# Patient Record
Sex: Female | Born: 1950
Health system: Southern US, Community
[De-identification: ages and names within clinical notes are randomized; demographics above are authoritative.]

## PROBLEM LIST (undated history)

## (undated) DIAGNOSIS — I10 Essential (primary) hypertension: Secondary | ICD-10-CM

## (undated) DIAGNOSIS — L570 Actinic keratosis: Secondary | ICD-10-CM

## (undated) DIAGNOSIS — L900 Lichen sclerosus et atrophicus: Secondary | ICD-10-CM

## (undated) DIAGNOSIS — M47812 Spondylosis without myelopathy or radiculopathy, cervical region: Secondary | ICD-10-CM

## (undated) HISTORY — DX: Lichen sclerosus et atrophicus: L90.0

## (undated) HISTORY — DX: Actinic keratosis: L57.0

## (undated) HISTORY — PX: EYE SURGERY: SHX253

## (undated) HISTORY — PX: BUNIONECTOMY: SHX129

## (undated) HISTORY — DX: Spondylosis without myelopathy or radiculopathy, cervical region: M47.812

## (undated) HISTORY — PX: LESION EXCISION: SHX5167

## (undated) HISTORY — DX: Essential (primary) hypertension: I10

---

## 2012-10-23 LAB — HM COLONOSCOPY

## 2016-06-03 DIAGNOSIS — R030 Elevated blood-pressure reading, without diagnosis of hypertension: Secondary | ICD-10-CM | POA: Insufficient documentation

## 2016-06-03 DIAGNOSIS — I10 Essential (primary) hypertension: Secondary | ICD-10-CM | POA: Insufficient documentation

## 2018-04-16 LAB — HM MAMMOGRAPHY

## 2018-06-15 LAB — HM DEXA SCAN

## 2019-03-14 ENCOUNTER — Ambulatory Visit: Payer: Self-pay | Admitting: Nurse Practitioner

## 2019-04-05 ENCOUNTER — Ambulatory Visit: Payer: Self-pay | Admitting: Nurse Practitioner

## 2019-04-23 ENCOUNTER — Encounter: Payer: Self-pay | Admitting: Nurse Practitioner

## 2019-04-23 DIAGNOSIS — M8580 Other specified disorders of bone density and structure, unspecified site: Secondary | ICD-10-CM | POA: Insufficient documentation

## 2019-04-29 ENCOUNTER — Other Ambulatory Visit: Payer: Self-pay

## 2019-04-30 ENCOUNTER — Encounter: Payer: Self-pay | Admitting: Nurse Practitioner

## 2019-04-30 ENCOUNTER — Ambulatory Visit (INDEPENDENT_AMBULATORY_CARE_PROVIDER_SITE_OTHER): Payer: Medicare Other | Admitting: Nurse Practitioner

## 2019-04-30 ENCOUNTER — Other Ambulatory Visit: Payer: Self-pay

## 2019-04-30 DIAGNOSIS — E7800 Pure hypercholesterolemia, unspecified: Secondary | ICD-10-CM | POA: Insufficient documentation

## 2019-04-30 DIAGNOSIS — L900 Lichen sclerosus et atrophicus: Secondary | ICD-10-CM | POA: Diagnosis not present

## 2019-04-30 DIAGNOSIS — R030 Elevated blood-pressure reading, without diagnosis of hypertension: Secondary | ICD-10-CM | POA: Diagnosis not present

## 2019-04-30 DIAGNOSIS — M8580 Other specified disorders of bone density and structure, unspecified site: Secondary | ICD-10-CM

## 2019-04-30 NOTE — Assessment & Plan Note (Signed)
Chronic, ongoing.  Does not wish to take statin.  Continue supplements and monitor labs.  Lipid panel next visit.

## 2019-04-30 NOTE — Progress Notes (Signed)
New Patient Office Visit  Subjective:  Patient ID: Laura Gibson, female    DOB: [birth year]  Age: 68 y.o. MRN: [id number]  CC:  Chief Complaint  Patient presents with  . Establish Care    no concerns     HPI [name] presents for new patient visit to establish care.  Introduced to Designer, jewellery role and practice setting.  All questions answered.  WHITE COAT HYPERTENSION No current medications, does endorse elevation of BP at provider offices, but they trend down after some minutes.  Not checking BP at this time due to having just moved. Hypertension status: stable  BP monitoring frequency:  not checking BP range:  Aspirin: no Recurrent headaches: no Visual changes: no Palpitations: no Dyspnea: no Chest pain: no Lower extremity edema: no Dizzy/lightheaded: no   ELEVATED LDL: Last LDL one year ago 141.  She prefers natural supplements.  Currently taking red yeast rice, plant sterol, COQ10.    LICHEN SCLEROSUS: Previously followed by GYN.  At one time used Premarin cream and Clobetasol for this, but has not used for awhile now.    OSTEOPENIA: Noted on DEXA 06/15/2018.  She continues on Vit D and Calcium.  No recent falls or fractures.  Past Medical History:  Diagnosis Date  . Lichen sclerosus     Past Surgical History:  Procedure Laterality Date  . BUNIONECTOMY    . LESION EXCISION     for lichen sclerosus    Family History  Problem Relation Age of Onset  . Cancer Mother        breast  . Osteoporosis Mother   . Heart disease Father   . Hypertension Father   . Hyperlipidemia Father   . Diabetes Sister   . Hyperlipidemia Sister   . Hyperlipidemia Brother   . Diabetes Paternal Grandmother     Social History   Socioeconomic History  . Marital status: Married    Spouse name: Not on file  . Number of children: Not on file  . Years of education: Not on file  . Highest education level: Not on file  Occupational History  . Not on file    Tobacco Use  . Smoking status: Never Smoker  . Smokeless tobacco: Never Used  Substance and Sexual Activity  . Alcohol use: Yes    Alcohol/week: 7.0 standard drinks    Types: 7 Glasses of wine per week  . Drug use: Never  . Sexual activity: Yes  Other Topics Concern  . Not on file  Social History Narrative  . Not on file   Social Determinants of Health   Financial Resource Strain:   . Difficulty of Paying Living Expenses: Not on file  Food Insecurity:   . Worried About Charity fundraiser in the Last Year: Not on file  . Ran Out of Food in the Last Year: Not on file  Transportation Needs:   . Lack of Transportation (Medical): Not on file  . Lack of Transportation (Non-Medical): Not on file  Physical Activity: Insufficiently Active  . Days of Exercise per Week: 3 days  . Minutes of Exercise per Session: 30 min  Stress:   . Feeling of Stress : Not on file  Social Connections:   . Frequency of Communication with Friends and Family: Not on file  . Frequency of Social Gatherings with Friends and Family: Not on file  . Attends Religious Services: Not on file  . Active Member of Clubs or Organizations: Not on  file  . Attends Archivist Meetings: Not on file  . Marital Status: Not on file  Intimate Partner Violence: Not At Risk  . Fear of Current or Ex-Partner: No  . Emotionally Abused: No  . Physically Abused: No  . Sexually Abused: No    ROS Review of Systems  Constitutional: Negative for activity change, appetite change, diaphoresis, fatigue and fever.  Respiratory: Negative for cough, chest tightness and shortness of breath.   Cardiovascular: Negative for chest pain, palpitations and leg swelling.  Gastrointestinal: Negative for abdominal distention, abdominal pain, constipation, diarrhea, nausea and vomiting.  Endocrine: Negative for cold intolerance and heat intolerance.  Neurological: Negative for dizziness, syncope, weakness, light-headedness, numbness  and headaches.  Psychiatric/Behavioral: Negative.     Objective:   Today's Vitals: BP 130/82 (BP Location: Left Arm, Patient Position: Sitting)   Pulse 71   Temp 98.1 F (36.7 C) (Oral)   Ht 5' 3.5" (1.613 m)   Wt 161 lb 12.8 oz (73.4 kg)   LMP  (LMP Unknown)   SpO2 99%   BMI 28.21 kg/m   Physical Exam Vitals and nursing note reviewed.  Constitutional:      General: She is awake. She is not in acute distress.    Appearance: She is well-developed. She is not ill-appearing.  HENT:     Head: Normocephalic.     Right Ear: Hearing normal.     Left Ear: Hearing normal.  Eyes:     General: Lids are normal.        Right eye: No discharge.        Left eye: No discharge.     Conjunctiva/sclera: Conjunctivae normal.     Pupils: Pupils are equal, round, and reactive to light.  Neck:     Thyroid: No thyromegaly.     Vascular: No carotid bruit.  Cardiovascular:     Rate and Rhythm: Normal rate and regular rhythm.     Heart sounds: Normal heart sounds. No murmur. No gallop.   Pulmonary:     Effort: Pulmonary effort is normal. No accessory muscle usage or respiratory distress.     Breath sounds: Normal breath sounds.  Abdominal:     General: Bowel sounds are normal.     Palpations: Abdomen is soft.     Tenderness: There is no abdominal tenderness.  Musculoskeletal:     Cervical back: Normal range of motion and neck supple.     Right lower leg: No edema.     Left lower leg: No edema.  Skin:    General: Skin is warm and dry.  Neurological:     Mental Status: She is alert and oriented to person, place, and time.  Psychiatric:        Attention and Perception: Attention normal.        Mood and Affect: Mood normal.        Behavior: Behavior normal. Behavior is cooperative.        Thought Content: Thought content normal.        Judgment: Judgment normal.     Assessment & Plan:   Problem List Items Addressed This Visit      Cardiovascular and Mediastinum   White coat  syndrome without hypertension    Initial BP elevated, but repeat below goal.  Recommend she monitor BP at home a few mornings a week and document.  Continue focus on DASH diet and exercise.  Initiate medication as needed, if consistent elevation.  Musculoskeletal and Integument   Osteopenia after menopause    Chronic, ongoing.  Continue daily supplements and repeat scan in 2025.      Lichen sclerosus    No current treatment.  Initiate treatment as needed.        Other   Elevated LDL cholesterol level    Chronic, ongoing.  Does not wish to take statin.  Continue supplements and monitor labs.  Lipid panel next visit.         Outpatient Encounter Medications as of 04/30/2019  Medication Sig  . calcium-vitamin D (OSCAL WITH D) 250-125 MG-UNIT tablet Take 1 tablet by mouth daily.  Marland Kitchen CINNAMON PO Take 1 tablet by mouth daily.  Marland Kitchen co-enzyme Q-10 30 MG capsule Take 1 capsule by mouth daily.  Marland Kitchen MAGNESIUM SULFATE PO Take 1 tablet by mouth daily.  . Multiple Vitamins-Minerals (MULTIVITAMIN ADULT PO) Take 1 tablet by mouth daily.  . Plant Sterols and Stanols 450 MG TABS Take 2 tablets by mouth daily.  . Red Yeast Rice Extract 600 MG CAPS Take 2 capsules by mouth daily.   No facility-administered encounter medications on file as of 04/30/2019.    Follow-up: Return in about 4 weeks (around 05/28/2019) for Annual physical.   Venita Lick, NP

## 2019-04-30 NOTE — Assessment & Plan Note (Signed)
Chronic, ongoing.  Continue daily supplements and repeat scan in 2025.

## 2019-04-30 NOTE — Patient Instructions (Signed)

## 2019-04-30 NOTE — Assessment & Plan Note (Signed)
Initial BP elevated, but repeat below goal.  Recommend she monitor BP at home a few mornings a week and document.  Continue focus on DASH diet and exercise.  Initiate medication as needed, if consistent elevation.

## 2019-04-30 NOTE — Assessment & Plan Note (Signed)
No current treatment.  Initiate treatment as needed.

## 2019-05-28 ENCOUNTER — Telehealth: Payer: Self-pay | Admitting: Nurse Practitioner

## 2019-05-28 NOTE — Telephone Encounter (Signed)
Called patient to schedule Medicare Annual Wellness Visit with Nurse Health Advisor, Tyler Aas at [facility].  DECLINED [place] . Embedded Care Coordination [facility] [email or web address]  [phone]

## 2019-05-29 ENCOUNTER — Encounter: Payer: Medicare Other | Admitting: Nurse Practitioner

## 2019-06-28 ENCOUNTER — Ambulatory Visit (INDEPENDENT_AMBULATORY_CARE_PROVIDER_SITE_OTHER): Payer: Medicare Other | Admitting: Nurse Practitioner

## 2019-06-28 ENCOUNTER — Other Ambulatory Visit: Payer: Self-pay

## 2019-06-28 ENCOUNTER — Encounter: Payer: Self-pay | Admitting: Nurse Practitioner

## 2019-06-28 VITALS — BP 128/82 | HR 79 | Temp 98.8°F | Ht 62.5 in | Wt 162.0 lb

## 2019-06-28 DIAGNOSIS — R030 Elevated blood-pressure reading, without diagnosis of hypertension: Secondary | ICD-10-CM | POA: Diagnosis not present

## 2019-06-28 DIAGNOSIS — L900 Lichen sclerosus et atrophicus: Secondary | ICD-10-CM | POA: Diagnosis not present

## 2019-06-28 DIAGNOSIS — M8580 Other specified disorders of bone density and structure, unspecified site: Secondary | ICD-10-CM | POA: Diagnosis not present

## 2019-06-28 DIAGNOSIS — Z0000 Encounter for general adult medical examination without abnormal findings: Secondary | ICD-10-CM

## 2019-06-28 DIAGNOSIS — Z803 Family history of malignant neoplasm of breast: Secondary | ICD-10-CM

## 2019-06-28 DIAGNOSIS — E7800 Pure hypercholesterolemia, unspecified: Secondary | ICD-10-CM

## 2019-06-28 DIAGNOSIS — Z780 Asymptomatic menopausal state: Secondary | ICD-10-CM

## 2019-06-28 DIAGNOSIS — E559 Vitamin D deficiency, unspecified: Secondary | ICD-10-CM

## 2019-06-28 DIAGNOSIS — Z1231 Encounter for screening mammogram for malignant neoplasm of breast: Secondary | ICD-10-CM

## 2019-06-28 NOTE — Assessment & Plan Note (Signed)
Mammogram ordered, can not obtain until 6 weeks after second Covid vaccine, approx around April 6th.

## 2019-06-28 NOTE — Assessment & Plan Note (Signed)
Ongoing.  Initial BP elevated, but repeat below goal.  Recommend she monitor BP at home a few mornings/evenings a week and document. Continue focus on DASH diet and exercise.  Initiate medication as needed, if consistent elevation and patient agreeable.

## 2019-06-28 NOTE — Progress Notes (Signed)
BP 128/82 (BP Location: Left Arm, Patient Position: Sitting) Comment: manual  Pulse 79   Temp 98.8 F (37.1 C) (Oral)   Ht 5' 2.5" (1.588 m)   Wt 162 lb (73.5 kg)   LMP  (LMP Unknown)   SpO2 95%   BMI 29.16 kg/m    Subjective:    Patient ID: [name], female    DOB: 23-Dec-1950, 69 y.o.   MRN: [id number]  HPI: Laura Gibson is a 69 y.o. female presenting on 06/28/2019 for comprehensive medical examination. Current medical complaints include:none  She currently lives with: daughter Menopausal Symptoms: no   HYPERTENSION & ELEVATED LDL White coat syndrome.  On previous labs LDL 141 and TCHOL 227 with previous provider. She prefers natural supplements.  Currently taking red yeast rice, plant sterol, COQ10.  Is not checking BP at home as of yet, BP cuff is in storage. Hypertension status: stable  Satisfied with current treatment? yes Duration of hypertension: chronic BP monitoring frequency:  not checking BP range:  BP medication side effects:  no Medication compliance: good compliance Aspirin: no Recurrent headaches: no Visual changes: no Palpitations: no Dyspnea: no Chest pain: no Lower extremity edema: no Dizzy/lightheaded: no  The ASCVD Risk score Mikey Bussing DC Jr., et al., 2013) failed to calculate for the following reasons:   Cannot find a previous HDL lab   LICHEN SCLEROSUS: Previously followed by GYN.  At one time used Premarin cream and Clobetasol for this, but has not used for awhile now.  This is not longer a problem.  OSTEOPENIA: Noted on DEXA 06/15/2018.  She continues on Vit D and Calcium.  No recent falls or fractures.  Depression Screen done today and results listed below:  Depression screen [facility] 2/9 06/28/2019 04/30/2019  Decreased Interest 0 0  Down, Depressed, Hopeless 0 0  PHQ - 2 Score 0 0    The patient does not have a history of falls. I did not complete a risk assessment for falls. A plan of care for falls was not documented.   Past  Medical History:  Past Medical History:  Diagnosis Date  . Lichen sclerosus     Surgical History:  Past Surgical History:  Procedure Laterality Date  . BUNIONECTOMY    . LESION EXCISION     for lichen sclerosus    Medications:  Current Outpatient Medications on File Prior to Visit  Medication Sig  . calcium-vitamin D (OSCAL WITH D) 250-125 MG-UNIT tablet Take 1 tablet by mouth daily.  Marland Kitchen CINNAMON PO Take 1 tablet by mouth daily.  Marland Kitchen co-enzyme Q-10 30 MG capsule Take 1 capsule by mouth daily.  Marland Kitchen MAGNESIUM SULFATE PO Take 1 tablet by mouth daily.  . Multiple Vitamins-Minerals (MULTIVITAMIN ADULT PO) Take 1 tablet by mouth daily.  . Plant Sterols and Stanols 450 MG TABS Take 2 tablets by mouth daily.  . Red Yeast Rice Extract 600 MG CAPS Take 2 capsules by mouth daily.   No current facility-administered medications on file prior to visit.    Allergies:  No Known Allergies  Social History:  Social History   Socioeconomic History  . Marital status: Married    Spouse name: Not on file  . Number of children: Not on file  . Years of education: Not on file  . Highest education level: Not on file  Occupational History  . Not on file  Tobacco Use  . Smoking status: Never Smoker  . Smokeless tobacco: Never Used  Substance and Sexual  Activity  . Alcohol use: Yes    Alcohol/week: 7.0 standard drinks    Types: 7 Glasses of wine per week  . Drug use: Never  . Sexual activity: Yes  Other Topics Concern  . Not on file  Social History Narrative  . Not on file   Social Determinants of Health   Financial Resource Strain:   . Difficulty of Paying Living Expenses: Not on file  Food Insecurity:   . Worried About Charity fundraiser in the Last Year: Not on file  . Ran Out of Food in the Last Year: Not on file  Transportation Needs:   . Lack of Transportation (Medical): Not on file  . Lack of Transportation (Non-Medical): Not on file  Physical Activity: Insufficiently Active   . Days of Exercise per Week: 3 days  . Minutes of Exercise per Session: 30 min  Stress:   . Feeling of Stress : Not on file  Social Connections:   . Frequency of Communication with Friends and Family: Not on file  . Frequency of Social Gatherings with Friends and Family: Not on file  . Attends Religious Services: Not on file  . Active Member of Clubs or Organizations: Not on file  . Attends Archivist Meetings: Not on file  . Marital Status: Not on file  Intimate Partner Violence: Not At Risk  . Fear of Current or Ex-Partner: No  . Emotionally Abused: No  . Physically Abused: No  . Sexually Abused: No   Social History   Tobacco Use  Smoking Status Never Smoker  Smokeless Tobacco Never Used   Social History   Substance and Sexual Activity  Alcohol Use Yes  . Alcohol/week: 7.0 standard drinks  . Types: 7 Glasses of wine per week    Family History:  Family History  Problem Relation Age of Onset  . Cancer Mother        breast  . Osteoporosis Mother   . Heart disease Father   . Hypertension Father   . Hyperlipidemia Father   . Diabetes Sister   . Hyperlipidemia Sister   . Hyperlipidemia Brother   . Diabetes Paternal Grandmother     Past medical history, surgical history, medications, allergies, family history and social history reviewed with patient today and changes made to appropriate areas of the chart.   Review of Systems - negative All other ROS negative except what is listed above and in the HPI.      Objective:    BP 128/82 (BP Location: Left Arm, Patient Position: Sitting) Comment: manual  Pulse 79   Temp 98.8 F (37.1 C) (Oral)   Ht 5' 2.5" (1.588 m)   Wt 162 lb (73.5 kg)   LMP  (LMP Unknown)   SpO2 95%   BMI 29.16 kg/m   Wt Readings from Last 3 Encounters:  06/28/19 162 lb (73.5 kg)  04/30/19 161 lb 12.8 oz (73.4 kg)    Physical Exam Constitutional:      General: She is awake. She is not in acute distress.    Appearance: She is  well-developed and overweight. She is not ill-appearing.  HENT:     Head: Normocephalic and atraumatic.     Right Ear: Hearing, tympanic membrane, ear canal and external ear normal. No drainage.     Left Ear: Hearing, tympanic membrane, ear canal and external ear normal. No drainage.     Nose: Nose normal.     Right Sinus: No maxillary sinus tenderness or  frontal sinus tenderness.     Left Sinus: No maxillary sinus tenderness or frontal sinus tenderness.     Mouth/Throat:     Mouth: Mucous membranes are moist.     Pharynx: Oropharynx is clear. Uvula midline. No pharyngeal swelling, oropharyngeal exudate or posterior oropharyngeal erythema.  Eyes:     General: Lids are normal.        Right eye: No discharge.        Left eye: No discharge.     Extraocular Movements: Extraocular movements intact.     Conjunctiva/sclera: Conjunctivae normal.     Pupils: Pupils are equal, round, and reactive to light.     Visual Fields: Right eye visual fields normal and left eye visual fields normal.  Neck:     Thyroid: No thyromegaly.     Vascular: No carotid bruit.     Trachea: Trachea normal.  Cardiovascular:     Rate and Rhythm: Normal rate and regular rhythm.     Heart sounds: Normal heart sounds. No murmur. No gallop.   Pulmonary:     Effort: Pulmonary effort is normal. No accessory muscle usage or respiratory distress.     Breath sounds: Normal breath sounds.  Chest:     Breasts:        Right: Normal.        Left: Normal.  Abdominal:     General: Bowel sounds are normal.     Palpations: Abdomen is soft. There is no hepatomegaly or splenomegaly.     Tenderness: There is no abdominal tenderness.  Musculoskeletal:        General: Normal range of motion.     Cervical back: Normal range of motion and neck supple.     Right lower leg: No edema.     Left lower leg: No edema.  Lymphadenopathy:     Head:     Right side of head: No submental, submandibular, tonsillar, preauricular or posterior  auricular adenopathy.     Left side of head: No submental, submandibular, tonsillar, preauricular or posterior auricular adenopathy.     Cervical: No cervical adenopathy.     Upper Body:     Right upper body: No supraclavicular, axillary or pectoral adenopathy.     Left upper body: No supraclavicular, axillary or pectoral adenopathy.  Skin:    General: Skin is warm and dry.     Capillary Refill: Capillary refill takes less than 2 seconds.     Findings: No rash.  Neurological:     Mental Status: She is alert and oriented to person, place, and time.     Cranial Nerves: Cranial nerves are intact.     Gait: Gait is intact.     Deep Tendon Reflexes: Reflexes are normal and symmetric.     Reflex Scores:      Brachioradialis reflexes are 2+ on the right side and 2+ on the left side.      Patellar reflexes are 2+ on the right side and 2+ on the left side. Psychiatric:        Attention and Perception: Attention normal.        Mood and Affect: Mood normal.        Speech: Speech normal.        Behavior: Behavior normal. Behavior is cooperative.        Thought Content: Thought content normal.        Judgment: Judgment normal.     Results for orders placed or performed in visit on  04/30/19  HM MAMMOGRAPHY  Result Value Ref Range   HM Mammogram 0-4 Bi-Rad 0-4 Bi-Rad, Self Reported Normal  HM DEXA SCAN  Result Value Ref Range   HM Dexa Scan see result in chart   HM COLONOSCOPY  Result Value Ref Range   HM Colonoscopy See Report (in chart) See Report (in chart), Patient Reported      Assessment & Plan:   Problem List Items Addressed This Visit      Cardiovascular and Mediastinum   White coat syndrome without hypertension    Ongoing.  Initial BP elevated, but repeat below goal.  Recommend she monitor BP at home a few mornings/evenings a week and document. Continue focus on DASH diet and exercise.  Initiate medication as needed, if consistent elevation and patient agreeable.       Relevant Orders   CBC with Differential/Platelet   Comprehensive metabolic panel   TSH     Musculoskeletal and Integument   Osteopenia after menopause    Continue supplements, check Vit D level today.  Next DEXA due in 2025.      Relevant Orders   VITAMIN D 25 Hydroxy (Vit-D Deficiency, Fractures)   Lichen sclerosus    No current treatment.  Initiate treatment as needed.  She would like to continue pap exams, next due December 2022.        Other   Elevated LDL cholesterol level    Chronic, ongoing.  Does not wish to take statin.  Continue supplements and monitor labs.  Lipid panel this visit.      Relevant Orders   Lipid Panel w/o Chol/HDL Ratio   Vitamin D deficiency    History of low levels, recheck today.  Continue supplement and adjust as needed.      Relevant Orders   VITAMIN D 25 Hydroxy (Vit-D Deficiency, Fractures)   Family history of breast cancer    Mammogram ordered, can not obtain until 6 weeks after second Covid vaccine, approx around April 6th.         Other Visit Diagnoses    Annual physical exam    -  Primary   Annual labs to include TSH, lipid, CMP, CBC   Relevant Orders   CBC with Differential/Platelet   Encounter for screening mammogram for malignant neoplasm of breast       mammogram order, family history of breast cancer at 59 in mother.   Relevant Orders   MM DIGITAL SCREENING BILATERAL       Follow up plan: Return in about 1 year (around 06/27/2020) for Annual physical.   LABORATORY TESTING:  - Pap smear: not applicable -- last pap December 2019, she would like to continue these, next 2022  IMMUNIZATIONS:   - Tdap: Tetanus vaccination status reviewed: refused today - Influenza: Up to date - Pneumovax: Up to date - Prevnar: Up to date - HPV: Not applicable - Zostavax vaccine: Refused  SCREENING: -Mammogram: Up to date -- last 04/2018 -- just had Covid vaccine and will wait for 6 weeks after second dose -- gets second February 26th -  Colonoscopy: Up to date - last June 2014 - Bone Density: Up to date - last 06/15/2018 -Hearing Test: Not applicable  -Spirometry: Not applicable   PATIENT COUNSELING:   Advised to take 1 mg of folate supplement per day if capable of pregnancy.   Sexuality: Discussed sexually transmitted diseases, partner selection, use of condoms, avoidance of unintended pregnancy  and contraceptive alternatives.   Advised to avoid cigarette  smoking.  I discussed with the patient that most people either abstain from alcohol or drink within safe limits (<=14/week and <=4 drinks/occasion for males, <=7/weeks and <= 3 drinks/occasion for females) and that the risk for alcohol disorders and other health effects rises proportionally with the number of drinks per week and how often a drinker exceeds daily limits.  Discussed cessation/primary prevention of drug use and availability of treatment for abuse.   Diet: Encouraged to adjust caloric intake to maintain  or achieve ideal body weight, to reduce intake of dietary saturated fat and total fat, to limit sodium intake by avoiding high sodium foods and not adding table salt, and to maintain adequate dietary potassium and calcium preferably from fresh fruits, vegetables, and low-fat dairy products.    stressed the importance of regular exercise  Injury prevention: Discussed safety belts, safety helmets, smoke detector, smoking near bedding or upholstery.   Dental health: Discussed importance of regular tooth brushing, flossing, and dental visits.    NEXT PREVENTATIVE PHYSICAL DUE IN 1 YEAR. Return in about 1 year (around 06/27/2020) for Annual physical.

## 2019-06-28 NOTE — Assessment & Plan Note (Signed)
Continue supplements, check Vit D level today.  Next DEXA due in 2025. °

## 2019-06-28 NOTE — Patient Instructions (Addendum)
[facility]  Address: [address] [place], [place], [place] [address]  Phone: [phone]  Health Maintenance, Female Adopting a healthy lifestyle and getting preventive care are important in promoting health and wellness. Ask your health care provider about:  The right schedule for you to have regular tests and exams.  Things you can do on your own to prevent diseases and keep yourself healthy. What should I know about diet, weight, and exercise? Eat a healthy diet   Eat a diet that includes plenty of vegetables, fruits, low-fat dairy products, and lean protein.  Do not eat a lot of foods that are high in solid fats, added sugars, or sodium. Maintain a healthy weight Body mass index (BMI) is used to identify weight problems. It estimates body fat based on height and weight. Your health care provider can help determine your BMI and help you achieve or maintain a healthy weight. Get regular exercise Get regular exercise. This is one of the most important things you can do for your health. Most adults should:  Exercise for at least 150 minutes each week. The exercise should increase your heart rate and make you sweat (moderate-intensity exercise).  Do strengthening exercises at least twice a week. This is in addition to the moderate-intensity exercise.  Spend less time sitting. Even light physical activity can be beneficial. Watch cholesterol and blood lipids Have your blood tested for lipids and cholesterol at 71 years of age, then have this test every 5 years. Have your cholesterol levels checked more often if:  Your lipid or cholesterol levels are high.  You are older than 71 years of age.  You are at high risk for heart disease. What should I know about cancer screening? Depending on your health history and family history, you may need to have cancer screening at various ages. This may include screening for:  Breast cancer.  Cervical  cancer.  Colorectal cancer.  Skin cancer.  Lung cancer. What should I know about heart disease, diabetes, and high blood pressure? Blood pressure and heart disease  High blood pressure causes heart disease and increases the risk of stroke. This is more likely to develop in people who have high blood pressure readings, are of African descent, or are overweight.  Have your blood pressure checked: ? Every 3-5 years if you are 39-42 years of age. ? Every year if you are 59 years old or older. Diabetes Have regular diabetes screenings. This checks your fasting blood sugar level. Have the screening done:  Once every three years after age 58 if you are at a normal weight and have a low risk for diabetes.  More often and at a younger age if you are overweight or have a high risk for diabetes. What should I know about preventing infection? Hepatitis B If you have a higher risk for hepatitis B, you should be screened for this virus. Talk with your health care provider to find out if you are at risk for hepatitis B infection. Hepatitis C Testing is recommended for:  Everyone born from [birth year] through 1967.  Anyone with known risk factors for hepatitis C. Sexually transmitted infections (STIs)  Get screened for STIs, including gonorrhea and chlamydia, if: ? You are sexually active and are younger than 71 years of age. ? You are older than 71 years of age and your health care provider tells you that you are at risk for this type of infection. ? Your sexual activity has changed  since you were last screened, and you are at increased risk for chlamydia or gonorrhea. Ask your health care provider if you are at risk.  Ask your health care provider about whether you are at high risk for HIV. Your health care provider may recommend a prescription medicine to help prevent HIV infection. If you choose to take medicine to prevent HIV, you should first get tested for HIV. You should then be tested every 3  months for as long as you are taking the medicine. Pregnancy  If you are about to stop having your period (premenopausal) and you may become pregnant, seek counseling before you get pregnant.  Take 400 to 800 micrograms (mcg) of folic acid every day if you become pregnant.  Ask for birth control (contraception) if you want to prevent pregnancy. Osteoporosis and menopause Osteoporosis is a disease in which the bones lose minerals and strength with aging. This can result in bone fractures. If you are 69 years old or older, or if you are at risk for osteoporosis and fractures, ask your health care provider if you should:  Be screened for bone loss.  Take a calcium or vitamin D supplement to lower your risk of fractures.  Be given hormone replacement therapy (HRT) to treat symptoms of menopause. Follow these instructions at home: Lifestyle  Do not use any products that contain nicotine or tobacco, such as cigarettes, e-cigarettes, and chewing tobacco. If you need help quitting, ask your health care provider.  Do not use street drugs.  Do not share needles.  Ask your health care provider for help if you need support or information about quitting drugs. Alcohol use  Do not drink alcohol if: ? Your health care provider tells you not to drink. ? You are pregnant, may be pregnant, or are planning to become pregnant.  If you drink alcohol: ? Limit how much you use to 0-1 drink a day. ? Limit intake if you are breastfeeding.  Be aware of how much alcohol is in your drink. In the [place] one drink equals one 12 oz bottle of beer (355 mL), one 5 oz glass of wine (148 mL), or one 1 oz glass of hard liquor (44 mL). General instructions  Schedule regular health, dental, and eye exams.  Stay current with your vaccines.  Tell your health care provider if: ? You often feel depressed. ? You have ever been abused or do not feel safe at home. Summary  Adopting a healthy lifestyle and getting  preventive care are important in promoting health and wellness.  Follow your health care provider's instructions about healthy diet, exercising, and getting tested or screened for diseases.  Follow your health care provider's instructions on monitoring your cholesterol and blood pressure. This information is not intended to replace advice given to you by your health care provider. Make sure you discuss any questions you have with your health care provider. Document Revised: 04/25/2018 Document Reviewed: 04/25/2018 Elsevier Patient Education  2020 [employer] Exercise Recommendation  Being physically active is important to prevent heart disease and stroke, the nation's No. 1and No. 5killers. To improve overall cardiovascular health, we suggest at least 150 minutes per week of moderate exercise or 75 minutes per week of vigorous exercise (or a combination of moderate and vigorous activity). Thirty minutes a day, five times a week is an easy goal to remember. You will also experience benefits even if you divide your time into two or three segments of  10 to 15 minutes per day.  For people who would benefit from lowering their blood pressure or cholesterol, we recommend 40 minutes of aerobic exercise of moderate to vigorous intensity three to four times a week to lower the risk for heart attack and stroke.  Physical activity is anything that makes you move your body and burn calories.  This includes things like climbing stairs or playing sports. Aerobic exercises benefit your heart, and include walking, jogging, swimming or biking. Strength and stretching exercises are best for overall stamina and flexibility.  The simplest, positive change you can make to effectively improve your heart health is to start walking. It's enjoyable, free, easy, social and great exercise. A walking program is flexible and boasts high success rates because people can stick with it. It's  easy for walking to become a regular and satisfying part of life.   For Overall Cardiovascular Health:  At least 30 minutes of moderate-intensity aerobic activity at least 5 days per week for a total of 150  OR   At least 25 minutes of vigorous aerobic activity at least 3 days per week for a total of 75 minutes; or a combination of moderate- and vigorous-intensity aerobic activity  AND   Moderate- to high-intensity muscle-strengthening activity at least 2 days per week for additional health benefits.  For Lowering Blood Pressure and Cholesterol  An average 40 minutes of moderate- to vigorous-intensity aerobic activity 3 or 4 times per week  What if I can't make it to the time goal? Something is always better than nothing! And everyone has to start somewhere. Even if you've been sedentary for years, today is the day you can begin to make healthy changes in your life. If you don't think you'll make it for 30 or 40 minutes, set a reachable goal for today. You can work up toward your overall goal by increasing your time as you get stronger. Don't let all-or-nothing thinking rob you of doing what you can every day.  Source:http://www.heart.org

## 2019-06-28 NOTE — Assessment & Plan Note (Signed)
History of low levels, recheck today.  Continue supplement and adjust as needed.

## 2019-06-28 NOTE — Assessment & Plan Note (Signed)
Chronic, ongoing.  Does not wish to take statin.  Continue supplements and monitor labs.  Lipid panel this visit. °

## 2019-06-28 NOTE — Assessment & Plan Note (Addendum)
No current treatment.  Initiate treatment as needed.  She would like to continue pap exams, next due December 2022. 

## 2019-06-29 LAB — CBC WITH DIFFERENTIAL/PLATELET
Basophils Absolute: 0 10*3/uL (ref 0.0–0.2)
Basos: 1 %
EOS (ABSOLUTE): 0.2 10*3/uL (ref 0.0–0.4)
Eos: 4 %
Hematocrit: 44.8 % (ref 34.0–46.6)
Hemoglobin: 14.7 g/dL (ref 11.1–15.9)
Immature Grans (Abs): 0 10*3/uL (ref 0.0–0.1)
Immature Granulocytes: 0 %
Lymphocytes Absolute: 1.4 10*3/uL (ref 0.7–3.1)
Lymphs: 27 %
MCH: 29.9 pg (ref 26.6–33.0)
MCHC: 32.8 g/dL (ref 31.5–35.7)
MCV: 91 fL (ref 79–97)
Monocytes Absolute: 0.4 10*3/uL (ref 0.1–0.9)
Monocytes: 8 %
Neutrophils Absolute: 3.2 10*3/uL (ref 1.4–7.0)
Neutrophils: 60 %
Platelets: 253 10*3/uL (ref 150–450)
RBC: 4.92 x10E6/uL (ref 3.77–5.28)
RDW: 12.4 % (ref 11.7–15.4)
WBC: 5.3 10*3/uL (ref 3.4–10.8)

## 2019-06-29 LAB — COMPREHENSIVE METABOLIC PANEL
ALT: 16 IU/L (ref 0–32)
AST: 16 IU/L (ref 0–40)
Albumin/Globulin Ratio: 1.7 (ref 1.2–2.2)
Albumin: 4.3 g/dL (ref 3.8–4.8)
Alkaline Phosphatase: 86 IU/L (ref 39–117)
BUN/Creatinine Ratio: 21 (ref 12–28)
BUN: 15 mg/dL (ref 8–27)
Bilirubin Total: 0.3 mg/dL (ref 0.0–1.2)
CO2: 25 mmol/L (ref 20–29)
Calcium: 9.5 mg/dL (ref 8.7–10.3)
Chloride: 102 mmol/L (ref 96–106)
Creatinine, Ser: 0.72 mg/dL (ref 0.57–1.00)
GFR calc Af Amer: 98 mL/min/{1.73_m2} (ref >59)
GFR calc non Af Amer: 85 mL/min/{1.73_m2} (ref >59)
Globulin, Total: 2.5 g/dL (ref 1.5–4.5)
Glucose: 94 mg/dL (ref 65–99)
Potassium: 4.8 mmol/L (ref 3.5–5.2)
Sodium: 143 mmol/L (ref 134–144)
Total Protein: 6.8 g/dL (ref 6.0–8.5)

## 2019-06-29 LAB — LIPID PANEL W/O CHOL/HDL RATIO
Cholesterol, Total: 242 mg/dL — ABNORMAL HIGH (ref 100–199)
HDL: 64 mg/dL (ref >39)
LDL Chol Calc (NIH): 152 mg/dL — ABNORMAL HIGH (ref 0–99)
Triglycerides: 144 mg/dL (ref 0–149)
VLDL Cholesterol Cal: 26 mg/dL (ref 5–40)

## 2019-06-29 LAB — VITAMIN D 25 HYDROXY (VIT D DEFICIENCY, FRACTURES): Vit D, 25-Hydroxy: 37.4 ng/mL (ref 30.0–100.0)

## 2019-06-29 LAB — TSH: TSH: 2.63 u[IU]/mL (ref 0.450–4.500)

## 2019-06-29 NOTE — Progress Notes (Signed)
Please let Ms. Betton, that labs have returned: - CBC shows no anemia - Kidney and liver function are normal - Thyroid testing normal and Vitamin D is normal.   - Cholesterol levels are still elevated, but cardiac risk score is 9.8%.  She can continue focus on diet and supplements at this time.  Recommend the American Heart Association for low cholesterol diet recipes. Overall she is doing a great job!!  It was wonderful to see her and hope she has a great day!!

## 2019-11-14 ENCOUNTER — Telehealth: Payer: Self-pay | Admitting: Nurse Practitioner

## 2019-11-14 NOTE — Telephone Encounter (Signed)
Copied from [place] [id number]. Topic: Medicare AWV >> Nov 14, 2019  2:10 PM Cher Nakai R wrote: Reason for CRM: Left message for patient to call back and schedule Medicare Annual Wellness Visit (AWV) to be done virtually.  No hx of AWV per [place] 01/15/2015  Please schedule at anytime with [facility] Ad[facility].      42 Minutes appointment

## 2020-01-03 ENCOUNTER — Inpatient Hospital Stay
Admission: RE | Admit: 2020-01-03 | Discharge: 2020-01-03 | Disposition: A | Discharge status: Home / Self Care | Payer: Self-pay | Source: Ambulatory Visit | Attending: *Deleted | Admitting: *Deleted

## 2020-01-03 ENCOUNTER — Other Ambulatory Visit: Payer: Self-pay

## 2020-01-03 ENCOUNTER — Other Ambulatory Visit: Payer: Self-pay | Admitting: *Deleted

## 2020-01-03 ENCOUNTER — Ambulatory Visit
Admission: RE | Admit: 2020-01-03 | Discharge: 2020-01-03 | Disposition: A | Discharge status: Home / Self Care | Payer: Medicare Other | Source: Ambulatory Visit | Attending: Nurse Practitioner | Admitting: Nurse Practitioner

## 2020-01-03 DIAGNOSIS — Z1231 Encounter for screening mammogram for malignant neoplasm of breast: Secondary | ICD-10-CM | POA: Diagnosis not present

## 2020-01-30 ENCOUNTER — Telehealth: Payer: Self-pay | Admitting: Nurse Practitioner

## 2020-01-30 NOTE — Telephone Encounter (Signed)
Patient declined the Medicare Wellness Visit with [facility]. Patient does not feel there is a need for her to have AWVS. Stated that her yearly physical is sufficient at this time.

## 2020-01-30 NOTE — Telephone Encounter (Signed)
Noted  

## 2020-04-27 ENCOUNTER — Telehealth: Payer: Self-pay | Admitting: Nurse Practitioner

## 2020-04-27 NOTE — Telephone Encounter (Signed)
Patient declined the Medicare Wellness Visit with [facility]  Patient is not interested scheduling. Only wants to schedule CPE with Provider

## 2020-07-06 ENCOUNTER — Ambulatory Visit (INDEPENDENT_AMBULATORY_CARE_PROVIDER_SITE_OTHER): Payer: Medicare Other | Admitting: Nurse Practitioner

## 2020-07-06 ENCOUNTER — Other Ambulatory Visit: Payer: Self-pay

## 2020-07-06 ENCOUNTER — Encounter: Payer: Self-pay | Admitting: Nurse Practitioner

## 2020-07-06 VITALS — BP 134/78 | HR 85 | Temp 98.4°F | Ht 62.5 in | Wt 165.8 lb

## 2020-07-06 DIAGNOSIS — Z803 Family history of malignant neoplasm of breast: Secondary | ICD-10-CM

## 2020-07-06 DIAGNOSIS — R030 Elevated blood-pressure reading, without diagnosis of hypertension: Secondary | ICD-10-CM | POA: Diagnosis not present

## 2020-07-06 DIAGNOSIS — M8580 Other specified disorders of bone density and structure, unspecified site: Secondary | ICD-10-CM | POA: Diagnosis not present

## 2020-07-06 DIAGNOSIS — E7800 Pure hypercholesterolemia, unspecified: Secondary | ICD-10-CM | POA: Diagnosis not present

## 2020-07-06 DIAGNOSIS — Z780 Asymptomatic menopausal state: Secondary | ICD-10-CM

## 2020-07-06 DIAGNOSIS — E559 Vitamin D deficiency, unspecified: Secondary | ICD-10-CM | POA: Diagnosis not present

## 2020-07-06 DIAGNOSIS — L900 Lichen sclerosus et atrophicus: Secondary | ICD-10-CM

## 2020-07-06 DIAGNOSIS — M542 Cervicalgia: Secondary | ICD-10-CM

## 2020-07-06 DIAGNOSIS — Z0000 Encounter for general adult medical examination without abnormal findings: Secondary | ICD-10-CM | POA: Diagnosis not present

## 2020-07-06 NOTE — Assessment & Plan Note (Signed)
Acute, wishes to try home treatment and switching pillow, if worsening will return for OMM with Dr. Wynetta Emery.

## 2020-07-06 NOTE — Assessment & Plan Note (Signed)
Continue yearly mammograms 

## 2020-07-06 NOTE — Assessment & Plan Note (Signed)
Chronic, ongoing.  Does not wish to take statin.  Continue supplements and monitor labs.  Lipid panel this visit. °

## 2020-07-06 NOTE — Assessment & Plan Note (Addendum)
Ongoing, check level outpatient.  Continue supplement and adjust as needed.

## 2020-07-06 NOTE — Progress Notes (Signed)
BP 134/78 (BP Location: Left Arm)   Pulse 85   Temp 98.4 F (36.9 C) (Oral)   Ht 5' 2.5" (1.588 m)   Wt 165 lb 12.8 oz (75.2 kg)   LMP  (LMP Unknown)   SpO2 97%   BMI 29.84 kg/m    Subjective:    Patient ID: Laura Gibson, female    DOB: 03-03-1951, 70 y.o.   MRN: [id number]  HPI: Laura Gibson is a 70 y.o. female presenting on 07/06/2020 for comprehensive medical examination. Current medical complaints include:none  She currently lives with: husband Menopausal Symptoms: no   HYPERTENSION & ELEVATED LDL White coat syndrome.  On previous labs LDL 152 and TCHOL 242 with previous provider. She prefers natural supplements. Currently taking red yeast rice, plant sterol, COQ10.  No BP cuff at home. Hypertension status: stable  Satisfied with current treatment? yes Duration of hypertension: chronic BP monitoring frequency:  not checking BP range:  BP medication side effects:  no Medication compliance: good compliance Aspirin: no Recurrent headaches: no Visual changes: no Palpitations: no Dyspnea: no Chest pain: no Lower extremity edema: no Dizzy/lightheaded: no  The 10-year ASCVD risk score Laura Bussing DC Jr., et al., 2013) is: 11.8%   Values used to calculate the score:     Age: 44 years     Sex: Female     Is Non-Hispanic African American: No     Diabetic: No     Tobacco smoker: No     Systolic Blood Pressure: 742 mmHg     Is BP treated: No     HDL Cholesterol: 64 mg/dL     Total Cholesterol: 595 mg/dL   LICHEN SCLEROSUS: Previously followed by GYN.  At one time used Premarin cream and Clobetasol for this, but has not used for awhile now.  This is not longer a problem.  OSTEOPENIA: Noted on DEXA 06/15/2018.  She continues on Vit D and Calcium.  No recent falls or fractures.  NECK PAIN  Has been present for about one month on and off, more when she wakes up in morning.  Her current pillow is older.  Current discomfort lasts <30  minutes. Location:midline Duration:months Severity: mild Quality: dull and aching Frequency: intermittent Radiation: none Aggravating factors: laying Alleviating factors: NSAIDs Weakness:  no Paresthesias / decreased sensation:  no  Fevers:  no  Depression Screen done today and results listed below:  Depression screen [facility] 2/9 07/06/2020 06/28/2019 04/30/2019  Decreased Interest 0 0 0  Down, Depressed, Hopeless 0 0 0  PHQ - 2 Score 0 0 0  Altered sleeping 1 - -  Tired, decreased energy 0 - -  Change in appetite 0 - -  Feeling bad or failure about yourself  0 - -  Trouble concentrating 0 - -  Moving slowly or fidgety/restless 0 - -  Suicidal thoughts 0 - -  PHQ-9 Score 1 - -    The patient does not have a history of falls. I did not complete a risk assessment for falls. A plan of care for falls was not documented.   Past Medical History:  Past Medical History:  Diagnosis Date  . Lichen sclerosus     Surgical History:  Past Surgical History:  Procedure Laterality Date  . BUNIONECTOMY    . LESION EXCISION     for lichen sclerosus    Medications:  Current Outpatient Medications on File Prior to Visit  Medication Sig  . calcium-vitamin D (OSCAL WITH D) 250-125 MG-UNIT  tablet Take 1 tablet by mouth daily.  Marland Kitchen CINNAMON PO Take 1 tablet by mouth daily.  Marland Kitchen co-enzyme Q-10 30 MG capsule Take 1 capsule by mouth daily.  Marland Kitchen MAGNESIUM SULFATE PO Take 1 tablet by mouth daily.  . Multiple Vitamins-Minerals (MULTIVITAMIN ADULT PO) Take 1 tablet by mouth daily.  . Plant Sterols and Stanols 450 MG TABS Take 2 tablets by mouth daily.  . Red Yeast Rice Extract 600 MG CAPS Take 2 capsules by mouth daily.   No current facility-administered medications on file prior to visit.    Allergies:  No Known Allergies  Social History:  Social History   Socioeconomic History  . Marital status: Married    Spouse name: Not on file  . Number of children: Not on file  . Years of education:  Not on file  . Highest education level: Not on file  Occupational History  . Not on file  Tobacco Use  . Smoking status: Never Smoker  . Smokeless tobacco: Never Used  Vaping Use  . Vaping Use: Never used  Substance and Sexual Activity  . Alcohol use: Yes    Alcohol/week: 7.0 standard drinks    Types: 7 Glasses of wine per week  . Drug use: Never  . Sexual activity: Yes  Other Topics Concern  . Not on file  Social History Narrative  . Not on file   Social Determinants of Health   Financial Resource Strain: Not on file  Food Insecurity: Not on file  Transportation Needs: Not on file  Physical Activity: Not on file  Stress: Not on file  Social Connections: Not on file  Intimate Partner Violence: Not on file   Social History   Tobacco Use  Smoking Status Never Smoker  Smokeless Tobacco Never Used   Social History   Substance and Sexual Activity  Alcohol Use Yes  . Alcohol/week: 7.0 standard drinks  . Types: 7 Glasses of wine per week    Family History:  Family History  Problem Relation Age of Onset  . Cancer Mother        breast  . Osteoporosis Mother   . Breast cancer Mother   . Heart disease Father   . Hypertension Father   . Hyperlipidemia Father   . Diabetes Sister   . Hyperlipidemia Sister   . Hyperlipidemia Brother   . Diabetes Paternal Grandmother     Past medical history, surgical history, medications, allergies, family history and social history reviewed with patient today and changes made to appropriate areas of the chart.   Review of Systems - negative All other ROS negative except what is listed above and in the HPI.      Objective:    BP 134/78 (BP Location: Left Arm)   Pulse 85   Temp 98.4 F (36.9 C) (Oral)   Ht 5' 2.5" (1.588 m)   Wt 165 lb 12.8 oz (75.2 kg)   LMP  (LMP Unknown)   SpO2 97%   BMI 29.84 kg/m   Wt Readings from Last 3 Encounters:  07/06/20 165 lb 12.8 oz (75.2 kg)  06/28/19 162 lb (73.5 kg)  04/30/19 161 lb  12.8 oz (73.4 kg)    Physical Exam Constitutional:      General: She is awake. She is not in acute distress.    Appearance: She is well-developed and overweight. She is not ill-appearing.  HENT:     Head: Normocephalic and atraumatic.     Right Ear: Hearing, tympanic membrane, ear  canal and external ear normal. No drainage.     Left Ear: Hearing, tympanic membrane, ear canal and external ear normal. No drainage.     Nose: Nose normal.     Right Sinus: No maxillary sinus tenderness or frontal sinus tenderness.     Left Sinus: No maxillary sinus tenderness or frontal sinus tenderness.     Mouth/Throat:     Mouth: Mucous membranes are moist.     Pharynx: Oropharynx is clear. Uvula midline. No pharyngeal swelling, oropharyngeal exudate or posterior oropharyngeal erythema.  Eyes:     General: Lids are normal.        Right eye: No discharge.        Left eye: No discharge.     Extraocular Movements: Extraocular movements intact.     Conjunctiva/sclera: Conjunctivae normal.     Pupils: Pupils are equal, round, and reactive to light.     Visual Fields: Right eye visual fields normal and left eye visual fields normal.  Neck:     Thyroid: No thyromegaly.     Vascular: No carotid bruit.     Trachea: Trachea normal.  Cardiovascular:     Rate and Rhythm: Normal rate and regular rhythm.     Heart sounds: Normal heart sounds. No murmur heard. No gallop.   Pulmonary:     Effort: Pulmonary effort is normal. No accessory muscle usage or respiratory distress.     Breath sounds: Normal breath sounds.  Chest:  Breasts:     Right: Normal. No axillary adenopathy or supraclavicular adenopathy.     Left: Normal. No axillary adenopathy or supraclavicular adenopathy.    Abdominal:     General: Bowel sounds are normal.     Palpations: Abdomen is soft. There is no hepatomegaly or splenomegaly.     Tenderness: There is no abdominal tenderness.  Musculoskeletal:        General: Normal range of motion.      Cervical back: Normal range of motion and neck supple.     Right lower leg: No edema.     Left lower leg: No edema.  Lymphadenopathy:     Head:     Right side of head: No submental, submandibular, tonsillar, preauricular or posterior auricular adenopathy.     Left side of head: No submental, submandibular, tonsillar, preauricular or posterior auricular adenopathy.     Cervical: No cervical adenopathy.     Upper Body:     Right upper body: No supraclavicular, axillary or pectoral adenopathy.     Left upper body: No supraclavicular, axillary or pectoral adenopathy.  Skin:    General: Skin is warm and dry.     Capillary Refill: Capillary refill takes less than 2 seconds.     Findings: No rash.  Neurological:     Mental Status: She is alert and oriented to person, place, and time.     Cranial Nerves: Cranial nerves are intact.     Gait: Gait is intact.     Deep Tendon Reflexes: Reflexes are normal and symmetric.     Reflex Scores:      Brachioradialis reflexes are 2+ on the right side and 2+ on the left side.      Patellar reflexes are 2+ on the right side and 2+ on the left side. Psychiatric:        Attention and Perception: Attention normal.        Mood and Affect: Mood normal.        Speech: Speech normal.  Behavior: Behavior normal. Behavior is cooperative.        Thought Content: Thought content normal.        Judgment: Judgment normal.     Results for orders placed or performed in visit on 06/28/19  CBC with Differential/Platelet  Result Value Ref Range   WBC 5.3 3.4 - 10.8 x10E3/uL   RBC 4.92 3.77 - 5.28 x10E6/uL   Hemoglobin 14.7 11.1 - 15.9 g/dL   Hematocrit 44.8 34.0 - 46.6 %   MCV 91 79 - 97 fL   MCH 29.9 26.6 - 33.0 pg   MCHC 32.8 31.5 - 35.7 g/dL   RDW 12.4 11.7 - 15.4 %   Platelets 253 150 - 450 x10E3/uL   Neutrophils 60 Not Estab. %   Lymphs 27 Not Estab. %   Monocytes 8 Not Estab. %   Eos 4 Not Estab. %   Basos 1 Not Estab. %   Neutrophils  Absolute 3.2 1.4 - 7.0 x10E3/uL   Lymphocytes Absolute 1.4 0.7 - 3.1 x10E3/uL   Monocytes Absolute 0.4 0.1 - 0.9 x10E3/uL   EOS (ABSOLUTE) 0.2 0.0 - 0.4 x10E3/uL   Basophils Absolute 0.0 0.0 - 0.2 x10E3/uL   Immature Granulocytes 0 Not Estab. %   Immature Grans (Abs) 0.0 0.0 - 0.1 x10E3/uL  Comprehensive metabolic panel  Result Value Ref Range   Glucose 94 65 - 99 mg/dL   BUN 15 8 - 27 mg/dL   Creatinine, Ser 0.72 0.57 - 1.00 mg/dL   GFR calc non Af Amer 85 >59 mL/min/1.73   GFR calc Af Amer 98 >59 mL/min/1.73   BUN/Creatinine Ratio 21 12 - 28   Sodium 143 134 - 144 mmol/L   Potassium 4.8 3.5 - 5.2 mmol/L   Chloride 102 96 - 106 mmol/L   CO2 25 20 - 29 mmol/L   Calcium 9.5 8.7 - 10.3 mg/dL   Total Protein 6.8 6.0 - 8.5 g/dL   Albumin 4.3 3.8 - 4.8 g/dL   Globulin, Total 2.5 1.5 - 4.5 g/dL   Albumin/Globulin Ratio 1.7 1.2 - 2.2   Bilirubin Total 0.3 0.0 - 1.2 mg/dL   Alkaline Phosphatase 86 39 - 117 IU/L   AST 16 0 - 40 IU/L   ALT 16 0 - 32 IU/L  TSH  Result Value Ref Range   TSH 2.630 0.450 - 4.500 uIU/mL  Lipid Panel w/o Chol/HDL Ratio  Result Value Ref Range   Cholesterol, Total 242 (H) 100 - 199 mg/dL   Triglycerides 144 0 - 149 mg/dL   HDL 64 >39 mg/dL   VLDL Cholesterol Cal 26 5 - 40 mg/dL   LDL Chol Calc (NIH) 152 (H) 0 - 99 mg/dL  VITAMIN D 25 Hydroxy (Vit-D Deficiency, Fractures)  Result Value Ref Range   Vit D, 25-Hydroxy 37.4 30.0 - 100.0 ng/mL      Assessment & Plan:   Problem List Items Addressed This Visit      Cardiovascular and Mediastinum   White coat syndrome without hypertension - Primary    Ongoing.  Initial BP elevated, but repeat below goal.  Recommend she monitor BP at home a few mornings/evenings a week and document. Continue focus on DASH diet and exercise.  Initiate medication as needed, if consistent elevation and patient agreeable.  CMP and TSH outpatient labs.      Relevant Orders   TSH   CBC with Differential/Platelet      Musculoskeletal and Integument   Osteopenia after menopause  Continue supplements, check Vit D level today.  Next DEXA due in 2025.      Relevant Orders   VITAMIN D 25 Hydroxy (Vit-D Deficiency, Fractures)     Other   Lichen sclerosus    No current treatment.  Initiate treatment as needed.  She would like to continue pap exams, next due December 2022.      Elevated LDL cholesterol level    Chronic, ongoing.  Does not wish to take statin.  Continue supplements and monitor labs.  Lipid panel this visit.      Relevant Orders   Comprehensive metabolic panel   Lipid Panel w/o Chol/HDL Ratio   Vitamin D deficiency    Ongoing, check level outpatient.  Continue supplement and adjust as needed.      Relevant Orders   VITAMIN D 25 Hydroxy (Vit-D Deficiency, Fractures)   Family history of breast cancer    Continue yearly mammograms.      Neck pain    Acute, wishes to try home treatment and switching pillow, if worsening will return for OMM with Dr. Wynetta Emery.       Other Visit Diagnoses    Annual physical exam       Annual labs today to include CBC, CMP, TSH, lipid       Follow up plan: Return in about 1 year (around 07/06/2021) for Annual physical.   LABORATORY TESTING:  - Pap smear: not applicable -- last pap December 2019, she would like to continue these, next 2022  IMMUNIZATIONS:   - Tdap: Tetanus vaccination status reviewed: refused today - Influenza: Up to date - Pneumovax: Up to date - Prevnar: Up to date - HPV: Not applicable - Zostavax vaccine: Refused  SCREENING: -Mammogram: Up to date  - Colonoscopy: Up to date - last June 2014 - Bone Density: Up to date - last 06/15/2018 -Hearing Test: Not applicable  -Spirometry: Not applicable   PATIENT COUNSELING:   Advised to take 1 mg of folate supplement per day if capable of pregnancy.   Sexuality: Discussed sexually transmitted diseases, partner selection, use of condoms, avoidance of unintended pregnancy  and  contraceptive alternatives.   Advised to avoid cigarette smoking.  I discussed with the patient that most people either abstain from alcohol or drink within safe limits (<=14/week and <=4 drinks/occasion for males, <=7/weeks and <= 3 drinks/occasion for females) and that the risk for alcohol disorders and other health effects rises proportionally with the number of drinks per week and how often a drinker exceeds daily limits.  Discussed cessation/primary prevention of drug use and availability of treatment for abuse.   Diet: Encouraged to adjust caloric intake to maintain  or achieve ideal body weight, to reduce intake of dietary saturated fat and total fat, to limit sodium intake by avoiding high sodium foods and not adding table salt, and to maintain adequate dietary potassium and calcium preferably from fresh fruits, vegetables, and low-fat dairy products.    stressed the importance of regular exercise  Injury prevention: Discussed safety belts, safety helmets, smoke detector, smoking near bedding or upholstery.   Dental health: Discussed importance of regular tooth brushing, flossing, and dental visits.    NEXT PREVENTATIVE PHYSICAL DUE IN 1 YEAR. Return in about 1 year (around 07/06/2021) for Annual physical.

## 2020-07-06 NOTE — Assessment & Plan Note (Signed)
Continue supplements, check Vit D level today.  Next DEXA due in 2025. °

## 2020-07-06 NOTE — Patient Instructions (Signed)

## 2020-07-06 NOTE — Assessment & Plan Note (Signed)
No current treatment.  Initiate treatment as needed.  She would like to continue pap exams, next due December 2022.

## 2020-07-06 NOTE — Assessment & Plan Note (Signed)
Ongoing.  Initial BP elevated, but repeat below goal.  Recommend she monitor BP at home a few mornings/evenings a week and document. Continue focus on DASH diet and exercise.  Initiate medication as needed, if consistent elevation and patient agreeable.  CMP and TSH outpatient labs.

## 2020-07-07 ENCOUNTER — Other Ambulatory Visit: Payer: Medicare Other

## 2020-07-07 ENCOUNTER — Other Ambulatory Visit: Payer: Self-pay

## 2020-07-07 DIAGNOSIS — E7800 Pure hypercholesterolemia, unspecified: Secondary | ICD-10-CM

## 2020-07-07 DIAGNOSIS — M8580 Other specified disorders of bone density and structure, unspecified site: Secondary | ICD-10-CM | POA: Diagnosis not present

## 2020-07-07 DIAGNOSIS — R030 Elevated blood-pressure reading, without diagnosis of hypertension: Secondary | ICD-10-CM

## 2020-07-07 DIAGNOSIS — Z780 Asymptomatic menopausal state: Secondary | ICD-10-CM | POA: Diagnosis not present

## 2020-07-07 DIAGNOSIS — E559 Vitamin D deficiency, unspecified: Secondary | ICD-10-CM

## 2020-07-08 LAB — COMPREHENSIVE METABOLIC PANEL
ALT: 25 IU/L (ref 0–32)
AST: 17 IU/L (ref 0–40)
Albumin/Globulin Ratio: 1.8 (ref 1.2–2.2)
Albumin: 4.1 g/dL (ref 3.7–4.7)
Alkaline Phosphatase: 93 IU/L (ref 44–121)
BUN/Creatinine Ratio: 23 (ref 12–28)
BUN: 16 mg/dL (ref 8–27)
Bilirubin Total: 0.3 mg/dL (ref 0.0–1.2)
CO2: 23 mmol/L (ref 20–29)
Calcium: 9.1 mg/dL (ref 8.7–10.3)
Chloride: 103 mmol/L (ref 96–106)
Creatinine, Ser: 0.7 mg/dL (ref 0.57–1.00)
GFR calc Af Amer: 101 mL/min/{1.73_m2} (ref >59)
GFR calc non Af Amer: 87 mL/min/{1.73_m2} (ref >59)
Globulin, Total: 2.3 g/dL (ref 1.5–4.5)
Glucose: 92 mg/dL (ref 65–99)
Potassium: 4.6 mmol/L (ref 3.5–5.2)
Sodium: 141 mmol/L (ref 134–144)
Total Protein: 6.4 g/dL (ref 6.0–8.5)

## 2020-07-08 LAB — LIPID PANEL W/O CHOL/HDL RATIO
Cholesterol, Total: 241 mg/dL — ABNORMAL HIGH (ref 100–199)
HDL: 63 mg/dL (ref >39)
LDL Chol Calc (NIH): 153 mg/dL — ABNORMAL HIGH (ref 0–99)
Triglycerides: 141 mg/dL (ref 0–149)
VLDL Cholesterol Cal: 25 mg/dL (ref 5–40)

## 2020-07-08 LAB — CBC WITH DIFFERENTIAL/PLATELET
Basophils Absolute: 0.1 10*3/uL (ref 0.0–0.2)
Basos: 1 %
EOS (ABSOLUTE): 0.3 10*3/uL (ref 0.0–0.4)
Eos: 5 %
Hematocrit: 42.5 % (ref 34.0–46.6)
Hemoglobin: 14.2 g/dL (ref 11.1–15.9)
Immature Grans (Abs): 0 10*3/uL (ref 0.0–0.1)
Immature Granulocytes: 0 %
Lymphocytes Absolute: 1.7 10*3/uL (ref 0.7–3.1)
Lymphs: 33 %
MCH: 30 pg (ref 26.6–33.0)
MCHC: 33.4 g/dL (ref 31.5–35.7)
MCV: 90 fL (ref 79–97)
Monocytes Absolute: 0.4 10*3/uL (ref 0.1–0.9)
Monocytes: 9 %
Neutrophils Absolute: 2.6 10*3/uL (ref 1.4–7.0)
Neutrophils: 52 %
Platelets: 228 10*3/uL (ref 150–450)
RBC: 4.74 x10E6/uL (ref 3.77–5.28)
RDW: 13 % (ref 11.7–15.4)
WBC: 5 10*3/uL (ref 3.4–10.8)

## 2020-07-08 LAB — VITAMIN D 25 HYDROXY (VIT D DEFICIENCY, FRACTURES): Vit D, 25-Hydroxy: 38.8 ng/mL (ref 30.0–100.0)

## 2020-07-08 LAB — TSH: TSH: 3.2 u[IU]/mL (ref 0.450–4.500)

## 2020-07-08 NOTE — Progress Notes (Signed)
Please let Ms. Wlodarczyk know her labs have returned.  Overall everything remains normal with exception of cholesterol levels which continue to show LDL, bad cholesterol, in the 150 range.  We do of course recommend statin for this, however I do know you prefer focusing on natural supplements.  Continue supplements and focus on diet and regular activity at home.  If any questions please let me know.  Have a wonderful day!! Keep being awesome!!  Thank you for allowing me to participate in your care. Kindest regards, [facility]

## 2020-08-17 ENCOUNTER — Telehealth: Payer: Self-pay

## 2020-08-17 NOTE — Telephone Encounter (Signed)
Labs mailed

## 2020-08-17 NOTE — Telephone Encounter (Signed)
Copied from [place] [id number]. Topic: General - Other >> Aug 17, 2020 12:04 PM Alanda Slim E wrote: Reason for CRM: Pt called and stated she never received a copy of her lab results and stated she would like a copy mailed to her home address/ address in chart has been verified / please advise

## 2020-10-26 ENCOUNTER — Telehealth: Payer: Self-pay

## 2020-10-26 NOTE — Telephone Encounter (Signed)
Please advise Jolene's pt  Copied from [place] [phone]. Topic: Appointment Scheduling - Scheduling Inquiry for Clinic >> Oct 26, 2020  1:01 PM Pawlus, Brayton Layman A wrote: Reason for CRM: Pt wanted to schedule a chiropractor appt with Dr Wynetta Emery, please advise if this Is possible or if she needs a referral. Please call back.

## 2020-10-29 NOTE — Telephone Encounter (Signed)
Spoke with patient and made her aware of Dr.Johnson's recommendations. Per patient she would like to do some research on her own and find an actual chiropractor to do those types of adjustments and also someone that takes her insurance. Patient states she would give our office a call back if she has any questions or to scheduled an appointment.

## 2020-11-25 ENCOUNTER — Other Ambulatory Visit: Payer: Self-pay | Admitting: Nurse Practitioner

## 2020-11-25 DIAGNOSIS — M9902 Segmental and somatic dysfunction of thoracic region: Secondary | ICD-10-CM | POA: Diagnosis not present

## 2020-11-25 DIAGNOSIS — M9901 Segmental and somatic dysfunction of cervical region: Secondary | ICD-10-CM | POA: Diagnosis not present

## 2020-11-25 DIAGNOSIS — M542 Cervicalgia: Secondary | ICD-10-CM | POA: Diagnosis not present

## 2020-11-25 DIAGNOSIS — Z1231 Encounter for screening mammogram for malignant neoplasm of breast: Secondary | ICD-10-CM

## 2020-11-25 DIAGNOSIS — M62830 Muscle spasm of back: Secondary | ICD-10-CM | POA: Diagnosis not present

## 2021-01-11 DIAGNOSIS — M542 Cervicalgia: Secondary | ICD-10-CM | POA: Diagnosis not present

## 2021-01-11 DIAGNOSIS — M62830 Muscle spasm of back: Secondary | ICD-10-CM | POA: Diagnosis not present

## 2021-01-11 DIAGNOSIS — M9901 Segmental and somatic dysfunction of cervical region: Secondary | ICD-10-CM | POA: Diagnosis not present

## 2021-01-11 DIAGNOSIS — M9902 Segmental and somatic dysfunction of thoracic region: Secondary | ICD-10-CM | POA: Diagnosis not present

## 2021-01-14 ENCOUNTER — Ambulatory Visit
Admission: RE | Admit: 2021-01-14 | Discharge: 2021-01-14 | Disposition: A | Discharge status: Home / Self Care | Payer: Medicare Other | Source: Ambulatory Visit | Attending: Nurse Practitioner | Admitting: Nurse Practitioner

## 2021-01-14 ENCOUNTER — Other Ambulatory Visit: Payer: Self-pay

## 2021-01-14 DIAGNOSIS — Z1231 Encounter for screening mammogram for malignant neoplasm of breast: Secondary | ICD-10-CM | POA: Insufficient documentation

## 2021-02-22 DIAGNOSIS — M62830 Muscle spasm of back: Secondary | ICD-10-CM | POA: Diagnosis not present

## 2021-02-22 DIAGNOSIS — M9902 Segmental and somatic dysfunction of thoracic region: Secondary | ICD-10-CM | POA: Diagnosis not present

## 2021-02-22 DIAGNOSIS — M9901 Segmental and somatic dysfunction of cervical region: Secondary | ICD-10-CM | POA: Diagnosis not present

## 2021-02-22 DIAGNOSIS — M542 Cervicalgia: Secondary | ICD-10-CM | POA: Diagnosis not present

## 2021-04-05 DIAGNOSIS — M542 Cervicalgia: Secondary | ICD-10-CM | POA: Diagnosis not present

## 2021-04-05 DIAGNOSIS — M9902 Segmental and somatic dysfunction of thoracic region: Secondary | ICD-10-CM | POA: Diagnosis not present

## 2021-04-05 DIAGNOSIS — M62830 Muscle spasm of back: Secondary | ICD-10-CM | POA: Diagnosis not present

## 2021-04-05 DIAGNOSIS — M9901 Segmental and somatic dysfunction of cervical region: Secondary | ICD-10-CM | POA: Diagnosis not present

## 2021-06-01 DIAGNOSIS — M62830 Muscle spasm of back: Secondary | ICD-10-CM | POA: Diagnosis not present

## 2021-06-01 DIAGNOSIS — M9902 Segmental and somatic dysfunction of thoracic region: Secondary | ICD-10-CM | POA: Diagnosis not present

## 2021-06-01 DIAGNOSIS — M9901 Segmental and somatic dysfunction of cervical region: Secondary | ICD-10-CM | POA: Diagnosis not present

## 2021-06-01 DIAGNOSIS — M542 Cervicalgia: Secondary | ICD-10-CM | POA: Diagnosis not present

## 2021-07-09 DIAGNOSIS — M9902 Segmental and somatic dysfunction of thoracic region: Secondary | ICD-10-CM | POA: Diagnosis not present

## 2021-07-09 DIAGNOSIS — M9901 Segmental and somatic dysfunction of cervical region: Secondary | ICD-10-CM | POA: Diagnosis not present

## 2021-07-09 DIAGNOSIS — M62830 Muscle spasm of back: Secondary | ICD-10-CM | POA: Diagnosis not present

## 2021-07-09 DIAGNOSIS — M542 Cervicalgia: Secondary | ICD-10-CM | POA: Diagnosis not present

## 2021-07-11 NOTE — Patient Instructions (Signed)

## 2021-07-12 ENCOUNTER — Other Ambulatory Visit: Payer: Self-pay

## 2021-07-12 ENCOUNTER — Encounter: Payer: Self-pay | Admitting: Nurse Practitioner

## 2021-07-12 ENCOUNTER — Ambulatory Visit (INDEPENDENT_AMBULATORY_CARE_PROVIDER_SITE_OTHER): Payer: Medicare Other | Admitting: Nurse Practitioner

## 2021-07-12 VITALS — BP 132/88 | HR 78 | Temp 97.9°F | Resp 20 | Ht 62.75 in | Wt 166.6 lb

## 2021-07-12 DIAGNOSIS — Z0000 Encounter for general adult medical examination without abnormal findings: Secondary | ICD-10-CM | POA: Diagnosis not present

## 2021-07-12 DIAGNOSIS — M8580 Other specified disorders of bone density and structure, unspecified site: Secondary | ICD-10-CM | POA: Diagnosis not present

## 2021-07-12 DIAGNOSIS — Z780 Asymptomatic menopausal state: Secondary | ICD-10-CM | POA: Diagnosis not present

## 2021-07-12 DIAGNOSIS — Z803 Family history of malignant neoplasm of breast: Secondary | ICD-10-CM | POA: Diagnosis not present

## 2021-07-12 DIAGNOSIS — E7800 Pure hypercholesterolemia, unspecified: Secondary | ICD-10-CM

## 2021-07-12 DIAGNOSIS — M542 Cervicalgia: Secondary | ICD-10-CM | POA: Diagnosis not present

## 2021-07-12 DIAGNOSIS — R030 Elevated blood-pressure reading, without diagnosis of hypertension: Secondary | ICD-10-CM | POA: Diagnosis not present

## 2021-07-12 DIAGNOSIS — Z1159 Encounter for screening for other viral diseases: Secondary | ICD-10-CM

## 2021-07-12 MED ORDER — LIDOCAINE 5 % EX PTCH: 1.0000 | MEDICATED_PATCH | CUTANEOUS | 0 refills | Status: DC

## 2021-07-12 NOTE — Assessment & Plan Note (Signed)
Chronic, ongoing.  Does not wish to take statin.  Continue supplements and monitor labs.  Lipid panel this visit.

## 2021-07-12 NOTE — Progress Notes (Signed)
BP 132/88 (BP Location: Left Arm, Patient Position: Sitting, Cuff Size: Normal)    Pulse 78    Temp 97.9 F (36.6 C) (Oral)    Resp 20    Ht 5' 2.75" (1.594 m)    Wt 166 lb 9.6 oz (75.6 kg)    LMP  (LMP Unknown)    SpO2 97%    BMI 29.75 kg/m    Subjective:    Patient ID: Laura Gibson, female    DOB: 18-Jun-1950, 71 y.o.   MRN: [id number]  HPI: Laura Gibson is a 71 y.o. female presenting on 07/12/2021 for comprehensive medical examination. Current medical complaints include:none  She currently lives with: husband Menopausal Symptoms: no   HYPERTENSION & ELEVATED LDL White coat syndrome.  She prefers natural supplements. Currently taking red yeast rice, plant sterol, COQ10.  No BP cuff at home. Hypertension status: stable  Satisfied with current treatment? yes Duration of hypertension: chronic BP monitoring frequency:  not checking BP range:  BP medication side effects:  no Medication compliance: good compliance Aspirin: no Recurrent headaches: no Visual changes: no Palpitations: no Dyspnea: no Chest pain: no Lower extremity edema: no Dizzy/lightheaded: no  The 10-year ASCVD risk score (Arnett DK, et al., 2019) is: 12.7%   Values used to calculate the score:     Age: 87 years     Sex: Female     Is Non-Hispanic African American: No     Diabetic: No     Tobacco smoker: No     Systolic Blood Pressure: 829 mmHg     Is BP treated: No     HDL Cholesterol: 63 mg/dL     Total Cholesterol: 241 mg/dL   OSTEOPENIA: Noted on DEXA 06/15/2018.  She continues on Vit D and Calcium.  No recent falls or fractures.  NECK PAIN  Has been present for about > 1 year, more when she wakes up in morning.  Purchased different pillow, but this offered no benefit.  Pain is a chronic issues.  Saw chiropractor who did imaging that showed arthritis per her report.  Also has one hip higher then the other, which chiropractor noted.  The pain at worst is 8-9/10, Tylenol/Ibuprofen reduce it to 3-4/10.    Location:midline Duration:months Severity: mild Quality: dull and aching Frequency: intermittent Radiation: none Aggravating factors: laying Alleviating factors: NSAIDs, creams Weakness:  no Paresthesias / decreased sensation:  no  Fevers:  no  Depression Screen done today and results listed below:  Depression screen [facility] 2/9 07/12/2021 07/06/2020 06/28/2019 04/30/2019  Decreased Interest 0 0 0 0  Down, Depressed, Hopeless 0 0 0 0  PHQ - 2 Score 0 0 0 0  Altered sleeping 1 1 - -  Tired, decreased energy 0 0 - -  Change in appetite 0 0 - -  Feeling bad or failure about yourself  0 0 - -  Trouble concentrating 0 0 - -  Moving slowly or fidgety/restless 0 0 - -  Suicidal thoughts 0 0 - -  PHQ-9 Score 1 1 - -    The patient does not have a history of falls. I did not complete a risk assessment for falls. A plan of care for falls was not documented.   Past Medical History:  Past Medical History:  Diagnosis Date   Arthritis of neck    Lichen sclerosus     Surgical History:  Past Surgical History:  Procedure Laterality Date   BUNIONECTOMY     LESION EXCISION  for lichen sclerosus    Medications:  Current Outpatient Medications on File Prior to Visit  Medication Sig   acetaminophen (TYLENOL) 500 MG tablet Take 500 mg by mouth every 6 (six) hours as needed.   calcium-vitamin D (OSCAL WITH D) 250-125 MG-UNIT tablet Take 1 tablet by mouth daily.   CINNAMON PO Take 1 tablet by mouth daily.   co-enzyme Q-10 30 MG capsule Take 1 capsule by mouth daily.   ibuprofen (ADVIL) 200 MG tablet Take 200 mg by mouth every 6 (six) hours as needed.   MAGNESIUM SULFATE PO Take 1 tablet by mouth daily.   Multiple Vitamins-Minerals (MULTIVITAMIN ADULT PO) Take 1 tablet by mouth daily.   Plant Sterols and Stanols 450 MG TABS Take 2 tablets by mouth daily.   Red Yeast Rice Extract 600 MG CAPS Take 2 capsules by mouth daily.   No current facility-administered medications on file prior to  visit.    Allergies:  No Known Allergies  Social History:  Social History   Socioeconomic History   Marital status: Married    Spouse name: Not on file   Number of children: Not on file   Years of education: Not on file   Highest education level: Not on file  Occupational History   Not on file  Tobacco Use   Smoking status: Never   Smokeless tobacco: Never  Vaping Use   Vaping Use: Never used  Substance and Sexual Activity   Alcohol use: Yes    Alcohol/week: 7.0 standard drinks    Types: 7 Glasses of wine per week   Drug use: Never   Sexual activity: Yes  Other Topics Concern   Not on file  Social History Narrative   Not on file   Social Determinants of Health   Financial Resource Strain: Not on file  Food Insecurity: Not on file  Transportation Needs: Not on file  Physical Activity: Not on file  Stress: Not on file  Social Connections: Not on file  Intimate Partner Violence: Not on file   Social History   Tobacco Use  Smoking Status Never  Smokeless Tobacco Never   Social History   Substance and Sexual Activity  Alcohol Use Yes   Alcohol/week: 7.0 standard drinks   Types: 7 Glasses of wine per week    Family History:  Family History  Problem Relation Age of Onset   Cancer Mother        breast   Osteoporosis Mother    Breast cancer Mother    Heart disease Father    Hypertension Father    Hyperlipidemia Father    Diabetes Sister    Hyperlipidemia Sister    Hyperlipidemia Brother    Diabetes Paternal Grandmother     Past medical history, surgical history, medications, allergies, family history and social history reviewed with patient today and changes made to appropriate areas of the chart.   Review of Systems - negative All other ROS negative except what is listed above and in the HPI.      Objective:    BP 132/88 (BP Location: Left Arm, Patient Position: Sitting, Cuff Size: Normal)    Pulse 78    Temp 97.9 F (36.6 C) (Oral)    Resp 20     Ht 5' 2.75" (1.594 m)    Wt 166 lb 9.6 oz (75.6 kg)    LMP  (LMP Unknown)    SpO2 97%    BMI 29.75 kg/m   Wt Readings from Last  3 Encounters:  07/12/21 166 lb 9.6 oz (75.6 kg)  07/06/20 165 lb 12.8 oz (75.2 kg)  06/28/19 162 lb (73.5 kg)    Physical Exam Constitutional:      General: She is awake. She is not in acute distress.    Appearance: She is well-developed and overweight. She is not ill-appearing.  HENT:     Head: Normocephalic and atraumatic.     Right Ear: Hearing, tympanic membrane, ear canal and external ear normal. No drainage.     Left Ear: Hearing, tympanic membrane, ear canal and external ear normal. No drainage.     Nose: Nose normal.     Right Sinus: No maxillary sinus tenderness or frontal sinus tenderness.     Left Sinus: No maxillary sinus tenderness or frontal sinus tenderness.     Mouth/Throat:     Mouth: Mucous membranes are moist.     Pharynx: Oropharynx is clear. Uvula midline. No pharyngeal swelling, oropharyngeal exudate or posterior oropharyngeal erythema.  Eyes:     General: Lids are normal.        Right eye: No discharge.        Left eye: No discharge.     Extraocular Movements: Extraocular movements intact.     Conjunctiva/sclera: Conjunctivae normal.     Pupils: Pupils are equal, round, and reactive to light.     Visual Fields: Right eye visual fields normal and left eye visual fields normal.  Neck:     Thyroid: No thyromegaly.     Vascular: No carotid bruit.     Trachea: Trachea normal.  Cardiovascular:     Rate and Rhythm: Normal rate and regular rhythm.     Heart sounds: Normal heart sounds. No murmur heard.   No gallop.  Pulmonary:     Effort: Pulmonary effort is normal. No accessory muscle usage or respiratory distress.     Breath sounds: Normal breath sounds.  Chest:  Breasts:    Right: Normal.     Left: Normal.  Abdominal:     General: Bowel sounds are normal.     Palpations: Abdomen is soft. There is no hepatomegaly or  splenomegaly.     Tenderness: There is no abdominal tenderness.  Musculoskeletal:        General: Normal range of motion.     Cervical back: Normal range of motion and neck supple.     Right lower leg: No edema.     Left lower leg: No edema.  Lymphadenopathy:     Head:     Right side of head: No submental, submandibular, tonsillar, preauricular or posterior auricular adenopathy.     Left side of head: No submental, submandibular, tonsillar, preauricular or posterior auricular adenopathy.     Cervical: No cervical adenopathy.     Upper Body:     Right upper body: No supraclavicular, axillary or pectoral adenopathy.     Left upper body: No supraclavicular, axillary or pectoral adenopathy.  Skin:    General: Skin is warm and dry.     Capillary Refill: Capillary refill takes less than 2 seconds.     Findings: No rash.  Neurological:     Mental Status: She is alert and oriented to person, place, and time.     Gait: Gait is intact.     Deep Tendon Reflexes: Reflexes are normal and symmetric.     Reflex Scores:      Brachioradialis reflexes are 2+ on the right side and 2+ on the left side.  Patellar reflexes are 2+ on the right side and 2+ on the left side. Psychiatric:        Attention and Perception: Attention normal.        Mood and Affect: Mood normal.        Speech: Speech normal.        Behavior: Behavior normal. Behavior is cooperative.        Thought Content: Thought content normal.        Judgment: Judgment normal.    Results for orders placed or performed in visit on 07/07/20  VITAMIN D 25 Hydroxy (Vit-D Deficiency, Fractures)  Result Value Ref Range   Vit D, 25-Hydroxy 38.8 30.0 - 100.0 ng/mL  CBC with Differential/Platelet  Result Value Ref Range   WBC 5.0 3.4 - 10.8 x10E3/uL   RBC 4.74 3.77 - 5.28 x10E6/uL   Hemoglobin 14.2 11.1 - 15.9 g/dL   Hematocrit 42.5 34.0 - 46.6 %   MCV 90 79 - 97 fL   MCH 30.0 26.6 - 33.0 pg   MCHC 33.4 31.5 - 35.7 g/dL   RDW 13.0  11.7 - 15.4 %   Platelets 228 150 - 450 x10E3/uL   Neutrophils 52 Not Estab. %   Lymphs 33 Not Estab. %   Monocytes 9 Not Estab. %   Eos 5 Not Estab. %   Basos 1 Not Estab. %   Neutrophils Absolute 2.6 1.4 - 7.0 x10E3/uL   Lymphocytes Absolute 1.7 0.7 - 3.1 x10E3/uL   Monocytes Absolute 0.4 0.1 - 0.9 x10E3/uL   EOS (ABSOLUTE) 0.3 0.0 - 0.4 x10E3/uL   Basophils Absolute 0.1 0.0 - 0.2 x10E3/uL   Immature Granulocytes 0 Not Estab. %   Immature Grans (Abs) 0.0 0.0 - 0.1 x10E3/uL  TSH  Result Value Ref Range   TSH 3.200 0.450 - 4.500 uIU/mL  Lipid Panel w/o Chol/HDL Ratio  Result Value Ref Range   Cholesterol, Total 241 (H) 100 - 199 mg/dL   Triglycerides 141 0 - 149 mg/dL   HDL 63 >39 mg/dL   VLDL Cholesterol Cal 25 5 - 40 mg/dL   LDL Chol Calc (NIH) 153 (H) 0 - 99 mg/dL  Comprehensive metabolic panel  Result Value Ref Range   Glucose 92 65 - 99 mg/dL   BUN 16 8 - 27 mg/dL   Creatinine, Ser 0.70 0.57 - 1.00 mg/dL   GFR calc non Af Amer 87 >59 mL/min/1.73   GFR calc Af Amer 101 >59 mL/min/1.73   BUN/Creatinine Ratio 23 12 - 28   Sodium 141 134 - 144 mmol/L   Potassium 4.6 3.5 - 5.2 mmol/L   Chloride 103 96 - 106 mmol/L   CO2 23 20 - 29 mmol/L   Calcium 9.1 8.7 - 10.3 mg/dL   Total Protein 6.4 6.0 - 8.5 g/dL   Albumin 4.1 3.7 - 4.7 g/dL   Globulin, Total 2.3 1.5 - 4.5 g/dL   Albumin/Globulin Ratio 1.8 1.2 - 2.2   Bilirubin Total 0.3 0.0 - 1.2 mg/dL   Alkaline Phosphatase 93 44 - 121 IU/L   AST 17 0 - 40 IU/L   ALT 25 0 - 32 IU/L      Assessment & Plan:   Problem List Items Addressed This Visit       Cardiovascular and Mediastinum   White coat syndrome without hypertension    Ongoing.  BP close to goal on check manually today.  Recommend she monitor BP at home a few mornings/evenings a week and document.  Continue focus on DASH diet and exercise.  Initiate medication as needed, if consistent elevation and patient agreeable.  CMP, CBC, and TSH, urine ALB outpatient  labs.      Relevant Orders   CBC with Differential/Platelet   Comprehensive metabolic panel   TSH   Microalbumin, Urine Waived     Musculoskeletal and Integument   Osteopenia after menopause    Continue supplements, check Vit D level today.  Next DEXA due in 2025.      Relevant Orders   VITAMIN D 25 Hydroxy (Vit-D Deficiency, Fractures)     Other   Elevated LDL cholesterol level    Chronic, ongoing.  Does not wish to take statin.  Continue supplements and monitor labs.  Lipid panel this visit.      Relevant Orders   Comprehensive metabolic panel   Lipid Panel w/o Chol/HDL Ratio   Family history of breast cancer    Continue yearly mammograms.      Neck pain - Primary    Ongoing issue, has seen chiropractor with minimal benefit -- noted per patient report to have OA on imaging.  Will place referral to Dr. Sharlet Salina with physiatry as patient is preferring alternate interventions to medications, is interested in injections if available.  Lidocaine patches sent in.      Relevant Orders   Ambulatory referral to Orthopedics   Other Visit Diagnoses     Encounter for annual physical exam       Annual physical with labs and health maintenance reviewed.        Follow up plan: Return in about 6 months (around 01/09/2022) for HTN and NECK PAIN.   LABORATORY TESTING:  - Pap smear: not applicable -- last pap December 2019, she would like to continue these, next 2022  IMMUNIZATIONS:   - Tdap: Tetanus vaccination status reviewed: refused today - Influenza: Up to date - Pneumovax: Up to date - Prevnar: Up to date - HPV: Not applicable - Zostavax vaccine: Has had one - Covid -- has had 3  SCREENING: -Mammogram: Up to date  - Colonoscopy: Up to date - last June 2014, she prefers 10 year, 2024 - Bone Density: Up to date - last 06/15/2018 -Hearing Test: Not applicable  -Spirometry: Not applicable   PATIENT COUNSELING:   Advised to take 1 mg of folate supplement per day if  capable of pregnancy.   Sexuality: Discussed sexually transmitted diseases, partner selection, use of condoms, avoidance of unintended pregnancy  and contraceptive alternatives.   Advised to avoid cigarette smoking.  I discussed with the patient that most people either abstain from alcohol or drink within safe limits (<=14/week and <=4 drinks/occasion for males, <=7/weeks and <= 3 drinks/occasion for females) and that the risk for alcohol disorders and other health effects rises proportionally with the number of drinks per week and how often a drinker exceeds daily limits.  Discussed cessation/primary prevention of drug use and availability of treatment for abuse.   Diet: Encouraged to adjust caloric intake to maintain  or achieve ideal body weight, to reduce intake of dietary saturated fat and total fat, to limit sodium intake by avoiding high sodium foods and not adding table salt, and to maintain adequate dietary potassium and calcium preferably from fresh fruits, vegetables, and low-fat dairy products.    Stressed the importance of regular exercise  Injury prevention: Discussed safety belts, safety helmets, smoke detector, smoking near bedding or upholstery.   Dental health: Discussed importance of regular tooth brushing, flossing,  and dental visits.    NEXT PREVENTATIVE PHYSICAL DUE IN 1 YEAR. Return in about 6 months (around 01/09/2022) for HTN and NECK PAIN.

## 2021-07-12 NOTE — Assessment & Plan Note (Signed)
Continue supplements, check Vit D level today.  Next DEXA due in 2025.

## 2021-07-12 NOTE — Assessment & Plan Note (Signed)
Ongoing.  BP close to goal on check manually today.  Recommend she monitor BP at home a few mornings/evenings a week and document. Continue focus on DASH diet and exercise.  Initiate medication as needed, if consistent elevation and patient agreeable.  CMP, CBC, and TSH, urine ALB outpatient labs.

## 2021-07-12 NOTE — Assessment & Plan Note (Signed)
Continue yearly mammograms 

## 2021-07-12 NOTE — Assessment & Plan Note (Signed)
Ongoing issue, has seen chiropractor with minimal benefit -- noted per patient report to have OA on imaging.  Will place referral to Dr. Sharlet Salina with physiatry as patient is preferring alternate interventions to medications, is interested in injections if available.  Lidocaine patches sent in.

## 2021-07-13 ENCOUNTER — Other Ambulatory Visit: Payer: Self-pay

## 2021-07-13 ENCOUNTER — Other Ambulatory Visit: Payer: Medicare Other

## 2021-07-13 DIAGNOSIS — E7800 Pure hypercholesterolemia, unspecified: Secondary | ICD-10-CM | POA: Diagnosis not present

## 2021-07-13 DIAGNOSIS — R030 Elevated blood-pressure reading, without diagnosis of hypertension: Secondary | ICD-10-CM

## 2021-07-13 DIAGNOSIS — M8580 Other specified disorders of bone density and structure, unspecified site: Secondary | ICD-10-CM | POA: Diagnosis not present

## 2021-07-13 DIAGNOSIS — Z780 Asymptomatic menopausal state: Secondary | ICD-10-CM | POA: Diagnosis not present

## 2021-07-13 LAB — MICROALBUMIN, URINE WAIVED
Creatinine, Urine Waived: 100 mg/dL (ref 10–300)
Microalb, Ur Waived: 10 mg/L (ref 0–19)
Microalb/Creat Ratio: <30 mg/g (ref <30)

## 2021-07-14 LAB — CBC WITH DIFFERENTIAL/PLATELET
Basophils Absolute: 0.1 10*3/uL (ref 0.0–0.2)
Basos: 1 %
EOS (ABSOLUTE): 0.3 10*3/uL (ref 0.0–0.4)
Eos: 5 %
Hematocrit: 44.9 % (ref 34.0–46.6)
Hemoglobin: 14.7 g/dL (ref 11.1–15.9)
Immature Grans (Abs): 0 10*3/uL (ref 0.0–0.1)
Immature Granulocytes: 0 %
Lymphocytes Absolute: 1.5 10*3/uL (ref 0.7–3.1)
Lymphs: 31 %
MCH: 29.7 pg (ref 26.6–33.0)
MCHC: 32.7 g/dL (ref 31.5–35.7)
MCV: 91 fL (ref 79–97)
Monocytes Absolute: 0.4 10*3/uL (ref 0.1–0.9)
Monocytes: 8 %
Neutrophils Absolute: 2.7 10*3/uL (ref 1.4–7.0)
Neutrophils: 55 %
Platelets: 251 10*3/uL (ref 150–450)
RBC: 4.95 x10E6/uL (ref 3.77–5.28)
RDW: 12.9 % (ref 11.7–15.4)
WBC: 5 10*3/uL (ref 3.4–10.8)

## 2021-07-14 LAB — LIPID PANEL W/O CHOL/HDL RATIO
Cholesterol, Total: 251 mg/dL — ABNORMAL HIGH (ref 100–199)
HDL: 73 mg/dL (ref >39)
LDL Chol Calc (NIH): 151 mg/dL — ABNORMAL HIGH (ref 0–99)
Triglycerides: 155 mg/dL — ABNORMAL HIGH (ref 0–149)
VLDL Cholesterol Cal: 27 mg/dL (ref 5–40)

## 2021-07-14 LAB — COMPREHENSIVE METABOLIC PANEL
ALT: 20 IU/L (ref 0–32)
AST: 16 IU/L (ref 0–40)
Albumin/Globulin Ratio: 1.6 (ref 1.2–2.2)
Albumin: 4.2 g/dL (ref 3.7–4.7)
Alkaline Phosphatase: 85 IU/L (ref 44–121)
BUN/Creatinine Ratio: 25 (ref 12–28)
BUN: 18 mg/dL (ref 8–27)
Bilirubin Total: 0.2 mg/dL (ref 0.0–1.2)
CO2: 24 mmol/L (ref 20–29)
Calcium: 9.6 mg/dL (ref 8.7–10.3)
Chloride: 103 mmol/L (ref 96–106)
Creatinine, Ser: 0.71 mg/dL (ref 0.57–1.00)
Globulin, Total: 2.6 g/dL (ref 1.5–4.5)
Glucose: 98 mg/dL (ref 70–99)
Potassium: 4.4 mmol/L (ref 3.5–5.2)
Sodium: 140 mmol/L (ref 134–144)
Total Protein: 6.8 g/dL (ref 6.0–8.5)
eGFR: 90 mL/min/{1.73_m2} (ref >59)

## 2021-07-14 LAB — TSH: TSH: 2.85 u[IU]/mL (ref 0.450–4.500)

## 2021-07-14 LAB — VITAMIN D 25 HYDROXY (VIT D DEFICIENCY, FRACTURES): Vit D, 25-Hydroxy: 34.7 ng/mL (ref 30.0–100.0)

## 2021-07-14 NOTE — Progress Notes (Signed)
Contacted via MyChart Good afternoon Ms. Louth, your labs have returned and overall all are stable with exception of cholesterol labs. Your cholesterol is still high, you would benefit from medication for prevention, but I know you prefer diet focus. Your LDL is above normal. The LDL is the bad cholesterol. Over time and in combination with inflammation and other factors, this contributes to plaque which in turn may lead to stroke and/or heart attack down the road. Sometimes high LDL is primarily genetic, and people might be eating all the right foods but still have high numbers. Other times, there is room for improvement in one's diet and eating healthier can bring this number down and potentially reduce one's risk of heart attack and/or stroke.   To reduce your LDL, Remember - more fruits and vegetables, more fish, and limit red meat and dairy products. More soy, nuts, beans, barley, lentils, oats and plant sterol ester enriched margarine instead of butter. I also encourage eliminating sugar and processed food. Remember, shop on the outside of the grocery store and visit your [employer]. If you would like to talk with me about dietary changes plus or minus medications for your cholesterol, please let me know. We should recheck your cholesterol in 3-6 months.  I will placed the ASCVD risk score we discussed below, often if >10% we do recommend statin use.  Any questions at all for me? Keep being amazing!!  Thank you for allowing me to participate in your care.  I appreciate you. Kindest regards, Starlynn Klinkner  The 10-year ASCVD risk score (Arnett DK, et al., 2019) is: 12.6%   Values used to calculate the score:     Age: 71 years     Sex: Female     Is Non-Hispanic African American: No     Diabetic: No     Tobacco smoker: No     Systolic Blood Pressure: 774 mmHg     Is BP treated: No     HDL Cholesterol: 73 mg/dL     Total Cholesterol: 251 mg/dL

## 2021-07-29 DIAGNOSIS — H353121 Nonexudative age-related macular degeneration, left eye, early dry stage: Secondary | ICD-10-CM | POA: Diagnosis not present

## 2021-07-29 DIAGNOSIS — H353112 Nonexudative age-related macular degeneration, right eye, intermediate dry stage: Secondary | ICD-10-CM | POA: Diagnosis not present

## 2021-07-29 DIAGNOSIS — H2513 Age-related nuclear cataract, bilateral: Secondary | ICD-10-CM | POA: Diagnosis not present

## 2021-08-06 DIAGNOSIS — M9901 Segmental and somatic dysfunction of cervical region: Secondary | ICD-10-CM | POA: Diagnosis not present

## 2021-08-06 DIAGNOSIS — M62830 Muscle spasm of back: Secondary | ICD-10-CM | POA: Diagnosis not present

## 2021-08-06 DIAGNOSIS — M9902 Segmental and somatic dysfunction of thoracic region: Secondary | ICD-10-CM | POA: Diagnosis not present

## 2021-08-06 DIAGNOSIS — M542 Cervicalgia: Secondary | ICD-10-CM | POA: Diagnosis not present

## 2021-08-09 ENCOUNTER — Other Ambulatory Visit: Payer: Self-pay | Admitting: Physical Medicine and Rehabilitation

## 2021-08-09 DIAGNOSIS — M4802 Spinal stenosis, cervical region: Secondary | ICD-10-CM | POA: Diagnosis not present

## 2021-08-09 DIAGNOSIS — M5412 Radiculopathy, cervical region: Secondary | ICD-10-CM | POA: Diagnosis not present

## 2021-08-18 ENCOUNTER — Ambulatory Visit: Payer: Medicare Other

## 2021-08-25 ENCOUNTER — Ambulatory Visit
Admission: RE | Admit: 2021-08-25 | Discharge: 2021-08-25 | Disposition: A | Discharge status: Home / Self Care | Payer: Medicare Other | Source: Ambulatory Visit | Attending: Physical Medicine and Rehabilitation | Admitting: Physical Medicine and Rehabilitation

## 2021-08-25 DIAGNOSIS — M5412 Radiculopathy, cervical region: Secondary | ICD-10-CM | POA: Insufficient documentation

## 2021-08-25 DIAGNOSIS — R200 Anesthesia of skin: Secondary | ICD-10-CM | POA: Diagnosis not present

## 2021-08-25 DIAGNOSIS — M542 Cervicalgia: Secondary | ICD-10-CM | POA: Diagnosis not present

## 2021-08-25 DIAGNOSIS — M2578 Osteophyte, vertebrae: Secondary | ICD-10-CM | POA: Diagnosis not present

## 2021-08-30 DIAGNOSIS — M4712 Other spondylosis with myelopathy, cervical region: Secondary | ICD-10-CM | POA: Diagnosis not present

## 2021-08-30 DIAGNOSIS — M5412 Radiculopathy, cervical region: Secondary | ICD-10-CM | POA: Diagnosis not present

## 2021-08-30 DIAGNOSIS — M4802 Spinal stenosis, cervical region: Secondary | ICD-10-CM | POA: Diagnosis not present

## 2021-09-02 ENCOUNTER — Encounter: Payer: Self-pay | Admitting: Nurse Practitioner

## 2021-09-02 DIAGNOSIS — E041 Nontoxic single thyroid nodule: Secondary | ICD-10-CM

## 2021-09-17 ENCOUNTER — Ambulatory Visit
Admission: RE | Admit: 2021-09-17 | Discharge: 2021-09-17 | Disposition: A | Discharge status: Home / Self Care | Payer: Medicare Other | Source: Ambulatory Visit | Attending: Nurse Practitioner | Admitting: Nurse Practitioner

## 2021-09-17 ENCOUNTER — Telehealth: Payer: Self-pay | Admitting: Nurse Practitioner

## 2021-09-17 DIAGNOSIS — E041 Nontoxic single thyroid nodule: Secondary | ICD-10-CM | POA: Insufficient documentation

## 2021-09-17 DIAGNOSIS — M47812 Spondylosis without myelopathy or radiculopathy, cervical region: Secondary | ICD-10-CM | POA: Diagnosis not present

## 2021-09-17 DIAGNOSIS — E042 Nontoxic multinodular goiter: Secondary | ICD-10-CM | POA: Diagnosis not present

## 2021-09-17 NOTE — Telephone Encounter (Signed)
Called and spoke with patient to review recent thyroid ultrasound results, discussed with her right side notes a thyroid nodule that is recommended to perform a biopsy on.  Has a couple more nodules that they recommend repeat imaging on in 5 years.  Educated her on nodules.  Offered her reassurance.  Will place referral to ENT to work on biopsy, discussed with her at length today.  All questions answered.  She stated appreciation for call. ?

## 2021-09-17 NOTE — Progress Notes (Signed)
Refer to telephone note 09/17/21

## 2021-10-04 DIAGNOSIS — M47812 Spondylosis without myelopathy or radiculopathy, cervical region: Secondary | ICD-10-CM | POA: Diagnosis not present

## 2021-10-15 DIAGNOSIS — M9901 Segmental and somatic dysfunction of cervical region: Secondary | ICD-10-CM | POA: Diagnosis not present

## 2021-10-15 DIAGNOSIS — M62830 Muscle spasm of back: Secondary | ICD-10-CM | POA: Diagnosis not present

## 2021-10-15 DIAGNOSIS — M9902 Segmental and somatic dysfunction of thoracic region: Secondary | ICD-10-CM | POA: Diagnosis not present

## 2021-10-15 DIAGNOSIS — M542 Cervicalgia: Secondary | ICD-10-CM | POA: Diagnosis not present

## 2021-10-21 DIAGNOSIS — M47812 Spondylosis without myelopathy or radiculopathy, cervical region: Secondary | ICD-10-CM | POA: Diagnosis not present

## 2021-10-22 DIAGNOSIS — E041 Nontoxic single thyroid nodule: Secondary | ICD-10-CM | POA: Diagnosis not present

## 2021-10-27 ENCOUNTER — Other Ambulatory Visit: Payer: Self-pay | Admitting: Otolaryngology

## 2021-10-27 DIAGNOSIS — E041 Nontoxic single thyroid nodule: Secondary | ICD-10-CM

## 2021-11-04 ENCOUNTER — Ambulatory Visit
Admission: RE | Admit: 2021-11-04 | Discharge: 2021-11-04 | Disposition: A | Discharge status: Home / Self Care | Payer: Medicare Other | Source: Ambulatory Visit | Attending: Otolaryngology | Admitting: Otolaryngology

## 2021-11-04 DIAGNOSIS — E041 Nontoxic single thyroid nodule: Secondary | ICD-10-CM | POA: Diagnosis not present

## 2021-11-04 DIAGNOSIS — D440 Neoplasm of uncertain behavior of thyroid gland: Secondary | ICD-10-CM | POA: Insufficient documentation

## 2021-11-05 LAB — CYTOLOGY - NON PAP

## 2021-12-08 DIAGNOSIS — M62830 Muscle spasm of back: Secondary | ICD-10-CM | POA: Diagnosis not present

## 2021-12-08 DIAGNOSIS — M9902 Segmental and somatic dysfunction of thoracic region: Secondary | ICD-10-CM | POA: Diagnosis not present

## 2021-12-08 DIAGNOSIS — M542 Cervicalgia: Secondary | ICD-10-CM | POA: Diagnosis not present

## 2021-12-08 DIAGNOSIS — M9901 Segmental and somatic dysfunction of cervical region: Secondary | ICD-10-CM | POA: Diagnosis not present

## 2021-12-13 DIAGNOSIS — M5412 Radiculopathy, cervical region: Secondary | ICD-10-CM | POA: Diagnosis not present

## 2021-12-13 DIAGNOSIS — M4802 Spinal stenosis, cervical region: Secondary | ICD-10-CM | POA: Diagnosis not present

## 2021-12-13 DIAGNOSIS — M7918 Myalgia, other site: Secondary | ICD-10-CM | POA: Diagnosis not present

## 2021-12-13 DIAGNOSIS — M47812 Spondylosis without myelopathy or radiculopathy, cervical region: Secondary | ICD-10-CM | POA: Diagnosis not present

## 2021-12-27 ENCOUNTER — Other Ambulatory Visit: Payer: Self-pay | Admitting: Nurse Practitioner

## 2021-12-27 DIAGNOSIS — Z1231 Encounter for screening mammogram for malignant neoplasm of breast: Secondary | ICD-10-CM

## 2022-01-06 DIAGNOSIS — M5412 Radiculopathy, cervical region: Secondary | ICD-10-CM | POA: Diagnosis not present

## 2022-01-06 DIAGNOSIS — M4802 Spinal stenosis, cervical region: Secondary | ICD-10-CM | POA: Diagnosis not present

## 2022-01-31 DIAGNOSIS — M62838 Other muscle spasm: Secondary | ICD-10-CM | POA: Diagnosis not present

## 2022-01-31 DIAGNOSIS — M5412 Radiculopathy, cervical region: Secondary | ICD-10-CM | POA: Diagnosis not present

## 2022-01-31 DIAGNOSIS — M4802 Spinal stenosis, cervical region: Secondary | ICD-10-CM | POA: Diagnosis not present

## 2022-02-03 ENCOUNTER — Ambulatory Visit
Admission: RE | Admit: 2022-02-03 | Discharge: 2022-02-03 | Disposition: A | Discharge status: Home / Self Care | Payer: Medicare Other | Source: Ambulatory Visit | Attending: Nurse Practitioner | Admitting: Nurse Practitioner

## 2022-02-03 DIAGNOSIS — Z1231 Encounter for screening mammogram for malignant neoplasm of breast: Secondary | ICD-10-CM | POA: Insufficient documentation

## 2022-02-04 NOTE — Progress Notes (Signed)
Contacted via MyChart   Normal mammogram, may repeat in one year:)

## 2022-03-22 DIAGNOSIS — M9902 Segmental and somatic dysfunction of thoracic region: Secondary | ICD-10-CM | POA: Diagnosis not present

## 2022-03-22 DIAGNOSIS — M9901 Segmental and somatic dysfunction of cervical region: Secondary | ICD-10-CM | POA: Diagnosis not present

## 2022-03-22 DIAGNOSIS — M62830 Muscle spasm of back: Secondary | ICD-10-CM | POA: Diagnosis not present

## 2022-03-22 DIAGNOSIS — M542 Cervicalgia: Secondary | ICD-10-CM | POA: Diagnosis not present

## 2022-05-05 ENCOUNTER — Ambulatory Visit: Payer: Self-pay | Admitting: *Deleted

## 2022-05-05 NOTE — Telephone Encounter (Signed)
  Chief Complaint: elevated BP due to being in pain from her neck.   Wants to discuss starting BP medication Symptoms: No symptoms but when she checks her BP it varies but is always elevated for her. Frequency: Every time she checks it at home. Pertinent Negatives: Patient denies symptoms. Disposition: '[]'$ ED /'[]'$ Urgent Care (no appt availability in office) / '[x]'$ Appointment(In office/virtual)/ '[]'$  [facility]/ '[]'$ Home Care/ '[]'$ Refused Recommended Disposition /'[]'$ [facility]/ '[]'$  Follow-up with PCP Additional Notes: Has an appt. With Marnee Guarneri, NP on 05/10/2022.  Went over s/s to go to the ED for.

## 2022-05-05 NOTE — Telephone Encounter (Signed)
Message from Scherrie Gerlach sent at 05/05/2022  9:49 AM EST  Summary: bp issues   Pt called to make appt w/ Jolene.  She said concerning her bp issues.  I asked what concerning her bp.  She said its high.  I asked how high. pt was very vague.  Pt did not want to answer my questions, she just said "every time I take my bp its high" Pt is not on any bp medication.  Unable to schedule pt until 12/26. It was difficult to get pt to tell me          Call History   Type Contact Phone/Fax User  05/05/2022 08:31 AM EST Phone (Incoming) Gibson, Laura Marteney (Self) [phone] Lemmie Evens) Scherrie Gerlach   Reason for Disposition  Systolic BP  >= 846 OR Diastolic >= 962    Has appt. 05/10/2022  Answer Assessment - Initial Assessment Questions 1. BLOOD PRESSURE: "What is the blood pressure?" "Did you take at least two measurements 5 minutes apart?"     I have problems with my neck so I'm in pain from it.    So it makes my BP elevated.  I'm wanting to talk with Jolene about getting on BP medications  The BP readings vary.   If I take the ibuprofen it comes down because of the pain being reduced.      157/94 to 174/90s,   194/90s.   Denies dizziness, shortness of breath, chest pain, visual changes, headaches.  Pt. Has an appt. With Marnee Guarneri, NP on 05/10/2022.   I went over the s/s to go to the ED if they occurred.   2. ONSET: "When did you take your blood pressure?"     I have a machine at home 3. HOW: "How did you take your blood pressure?" (e.g., automatic home BP monitor, visiting nurse)     Upper arm cuff 4. HISTORY: "Do you have a history of high blood pressure?"     No 5. MEDICINES: "Are you taking any medicines for blood pressure?" "Have you missed any doses recently?"     No  6. OTHER SYMPTOMS: "Do you have any symptoms?" (e.g., blurred vision, chest pain, difficulty breathing, headache, weakness)     No symptoms  7. PREGNANCY: "Is there any chance you are pregnant?" "When was your last  menstrual period?"     No  Protocols used: Blood Pressure - High-A-AH

## 2022-05-08 NOTE — Patient Instructions (Signed)

## 2022-05-10 ENCOUNTER — Encounter: Payer: Self-pay | Admitting: Nurse Practitioner

## 2022-05-10 ENCOUNTER — Ambulatory Visit (INDEPENDENT_AMBULATORY_CARE_PROVIDER_SITE_OTHER): Payer: Medicare Other | Admitting: Nurse Practitioner

## 2022-05-10 VITALS — BP 142/82 | HR 89 | Temp 98.2°F | Ht 62.76 in | Wt 167.4 lb

## 2022-05-10 DIAGNOSIS — I10 Essential (primary) hypertension: Secondary | ICD-10-CM | POA: Diagnosis not present

## 2022-05-10 DIAGNOSIS — E041 Nontoxic single thyroid nodule: Secondary | ICD-10-CM

## 2022-05-10 MED ORDER — LOSARTAN POTASSIUM 25 MG PO TABS: 25.0000 mg | ORAL_TABLET | Freq: Every day | ORAL | 3 refills | Status: DC

## 2022-05-10 NOTE — Progress Notes (Signed)
BP (!) 142/82 (BP Location: Left Arm, Patient Position: Sitting, Cuff Size: Normal)   Pulse 89   Temp 98.2 F (36.8 C) (Oral)   Ht 5' 2.76" (1.594 m)   Wt 167 lb 6.4 oz (75.9 kg)   LMP  (LMP Unknown)   SpO2 95%   BMI 29.88 kg/m    Subjective:    Patient ID: Laura Gibson, female    DOB: 1951-04-26, 71 y.o.   MRN: [id number]  HPI: Laura Gibson is a 71 y.o. female  Chief Complaint  Patient presents with   Hypertension   HYPERTENSION  Has had some elevations on BP recently in office settings -- on [employer]am[employer].  Some elevations on home readings as well.  She is following with Dr. Sharlet Salina for cervical radiculitis with injections + seeing chiropractor.  Follows with Dr. Richardson Landry for recent imaging noting enlargement in thyroid, they tried to perform biopsy on 11/04/21, not enough cells able to be obtained.  Returns January for follow-up with them.   Hypertension status: stable  Satisfied with current treatment? yes Duration of hypertension: chronic BP monitoring frequency:  a few times a week BP range: 201/88 this morning on her machine at home Previous BP meds: none Aspirin: no Recurrent headaches: no Visual changes: no Palpitations: no Dyspnea: no Chest pain: no Lower extremity edema: no Dizzy/lightheaded: no   Relevant past medical, surgical, family and social history reviewed and updated as indicated. Interim medical history since our last visit reviewed. Allergies and medications reviewed and updated.  Review of Systems  Constitutional:  Negative for activity change, appetite change, diaphoresis, fatigue and fever.  Respiratory:  Negative for cough, chest tightness and shortness of breath.   Cardiovascular:  Negative for chest pain, palpitations and leg swelling.  Neurological: Negative.   Psychiatric/Behavioral: Negative.      Per HPI unless specifically indicated above     Objective:    BP (!) 142/82 (BP Location: Left Arm, Patient Position: Sitting, Cuff  Size: Normal)   Pulse 89   Temp 98.2 F (36.8 C) (Oral)   Ht 5' 2.76" (1.594 m)   Wt 167 lb 6.4 oz (75.9 kg)   LMP  (LMP Unknown)   SpO2 95%   BMI 29.88 kg/m   Wt Readings from Last 3 Encounters:  05/10/22 167 lb 6.4 oz (75.9 kg)  07/12/21 166 lb 9.6 oz (75.6 kg)  07/06/20 165 lb 12.8 oz (75.2 kg)    Physical Exam Vitals and nursing note reviewed.  Constitutional:      General: She is awake. She is not in acute distress.    Appearance: She is well-developed and well-groomed. She is not ill-appearing or toxic-appearing.  HENT:     Head: Normocephalic.     Right Ear: Hearing normal.     Left Ear: Hearing normal.  Eyes:     General: Lids are normal.        Right eye: No discharge.        Left eye: No discharge.     Conjunctiva/sclera: Conjunctivae normal.     Pupils: Pupils are equal, round, and reactive to light.  Neck:     Thyroid: No thyromegaly.     Vascular: No carotid bruit.  Cardiovascular:     Rate and Rhythm: Normal rate and regular rhythm.     Heart sounds: Normal heart sounds. No murmur heard.    No gallop.  Pulmonary:     Effort: Pulmonary effort is normal. No accessory muscle usage or  respiratory distress.     Breath sounds: Normal breath sounds.  Abdominal:     General: Bowel sounds are normal.     Palpations: Abdomen is soft. There is no hepatomegaly or splenomegaly.  Musculoskeletal:     Cervical back: Normal range of motion and neck supple.     Right lower leg: No edema.     Left lower leg: No edema.  Lymphadenopathy:     Cervical: No cervical adenopathy.  Skin:    General: Skin is warm and dry.  Neurological:     Mental Status: She is alert and oriented to person, place, and time.  Psychiatric:        Attention and Perception: Attention normal.        Mood and Affect: Mood normal.        Speech: Speech normal.        Behavior: Behavior normal. Behavior is cooperative.        Thought Content: Thought content normal.    Results for orders  placed or performed during the hospital encounter of 11/04/21  Cytology - Non PAP;  Result Value Ref Range   CYTOLOGY - NON GYN      CYTOLOGY - NON PAP CASE: [id number] PATIENT: Azia Tramontana Non-Gynecological Cytology Report     Specimen Submitted: A. Thyroid, right superior; FNA  Clinical History: None provided      DIAGNOSIS: A.  THYROID, RIGHT SUPERIOR, NODULE; FINE-NEEDLE ASPIRATE: - NONDIAGNOSTIC (BETHESDA SYSTEM I ) (SEE COMMENT).  Comment: The multiple aspirate smears are obscured by ultrasound lubricant and blood.  Technically there are 6 groups present but there are degenerated and difficult to evaluate, and therefore do not meet criteria for an adequate specimen.  The slides have been reviewed by additional pathologist who agrees.  GROSS DESCRIPTION: A. Site: Right superior thyroid Procedure: Ultrasound-guided FNA Cytotechnologist(s): Rivka Barbara, Quitman Livings  Specimen material collected and submitted for: 6 diff Quik stained slides 6 Pap stained slides Material for ThyroSeq if needed, labeled with the patients name, date of birth, specimen site, and collection da te ThinPrep: 1  Specimen description: Fixative: Cytoloyt Volume: Approximately 20 mL Color: Light red Transparency: Clear Tissue fragments present: No  RB 11/04/2021   Final Diagnosis performed by Theodora Blow, MD.   Electronically signed 11/05/2021 4:30:54PM The electronic signature indicates that the named Attending Pathologist has evaluated the specimen Technical component performed at [facility], [address], [place], [place] [address] Lab: [phone] Dir: Rush Farmer, MD, MMM  Professional component performed at [facility], [facility], [place], [place], [place] [address] Lab: [phone] Dir: Kathi Simpers, MD       Assessment & Plan:   Problem List Items Addressed This Visit       Cardiovascular and Mediastinum   Essential hypertension -  Primary    Ongoing with intermittent elevations recent office visits and elevations on home BP.  Due to poor consistency of regular BP <130/80 will start Losartan at 25 MG daily.  Educated her on this medication, she is familiar with it as her husband takes this.  Recommend he monitor BP at least a few mornings a week at home and document.  DASH diet at home.  Labs today: will obtain next visit.  Return in February as scheduled for physical, to bring her home BP cuff with her.      Relevant Medications   losartan (COZAAR) 25 MG tablet     Endocrine   Thyroid nodule    Noted  on imaging 08/25/21, biopsy performed 11/04/21 with insufficient sample.  She is scheduled to follow-up with ENT upcoming.  Plan for thyroid labs at physical.        Follow up plan: Return for as scheduled at end of February for physical.

## 2022-05-10 NOTE — Assessment & Plan Note (Signed)
Ongoing with intermittent elevations recent office visits and elevations on home BP.  Due to poor consistency of regular BP <130/80 will start Losartan at 25 MG daily.  Educated her on this medication, she is familiar with it as her husband takes this.  Recommend he monitor BP at least a few mornings a week at home and document.  DASH diet at home.  Labs today: will obtain next visit.  Return in February as scheduled for physical, to bring her home BP cuff with her.

## 2022-05-10 NOTE — Assessment & Plan Note (Signed)
Noted on imaging 08/25/21, biopsy performed 11/04/21 with insufficient sample.  She is scheduled to follow-up with ENT upcoming.  Plan for thyroid labs at physical.

## 2022-06-06 ENCOUNTER — Other Ambulatory Visit: Payer: Self-pay | Admitting: Otolaryngology

## 2022-06-06 DIAGNOSIS — E042 Nontoxic multinodular goiter: Secondary | ICD-10-CM | POA: Diagnosis not present

## 2022-06-06 DIAGNOSIS — E041 Nontoxic single thyroid nodule: Secondary | ICD-10-CM

## 2022-07-09 NOTE — Patient Instructions (Signed)

## 2022-07-13 ENCOUNTER — Encounter: Payer: Self-pay | Admitting: Nurse Practitioner

## 2022-07-13 ENCOUNTER — Ambulatory Visit (INDEPENDENT_AMBULATORY_CARE_PROVIDER_SITE_OTHER): Payer: Medicare Other | Admitting: Nurse Practitioner

## 2022-07-13 VITALS — BP 132/88 | HR 86 | Temp 97.6°F | Ht 62.76 in | Wt 165.5 lb

## 2022-07-13 DIAGNOSIS — Z803 Family history of malignant neoplasm of breast: Secondary | ICD-10-CM

## 2022-07-13 DIAGNOSIS — M8580 Other specified disorders of bone density and structure, unspecified site: Secondary | ICD-10-CM | POA: Diagnosis not present

## 2022-07-13 DIAGNOSIS — L989 Disorder of the skin and subcutaneous tissue, unspecified: Secondary | ICD-10-CM | POA: Diagnosis not present

## 2022-07-13 DIAGNOSIS — E7800 Pure hypercholesterolemia, unspecified: Secondary | ICD-10-CM | POA: Diagnosis not present

## 2022-07-13 DIAGNOSIS — I10 Essential (primary) hypertension: Secondary | ICD-10-CM

## 2022-07-13 DIAGNOSIS — Z0000 Encounter for general adult medical examination without abnormal findings: Secondary | ICD-10-CM

## 2022-07-13 DIAGNOSIS — Z780 Asymptomatic menopausal state: Secondary | ICD-10-CM

## 2022-07-13 DIAGNOSIS — E041 Nontoxic single thyroid nodule: Secondary | ICD-10-CM | POA: Diagnosis not present

## 2022-07-13 MED ORDER — LOSARTAN POTASSIUM 25 MG PO TABS: 25.0000 mg | ORAL_TABLET | Freq: Every day | ORAL | 4 refills | Status: DC

## 2022-07-13 NOTE — Assessment & Plan Note (Signed)
Chronic, ongoing.  Does not wish to take statin, have discussed with her at length.  Continue supplements and monitor labs.  Lipid panel today.

## 2022-07-13 NOTE — Assessment & Plan Note (Signed)
?  irritated skin tag vs basal cell.  She would like referral to dermatology, will place this as she also has other areas of concern, but would like to start out with assessment of lesion to arm.

## 2022-07-13 NOTE — Assessment & Plan Note (Signed)
Chronic.  Continue supplements, check Vit D level today.  Next DEXA due in 2025.

## 2022-07-13 NOTE — Progress Notes (Signed)
BP 132/88 (BP Location: Left Arm, Patient Position: Sitting, Cuff Size: Normal)   Pulse 86   Temp 97.6 F (36.4 C) (Oral)   Ht 5' 2.76" (1.594 m)   Wt 165 lb 8 oz (75.1 kg)   LMP  (LMP Unknown)   SpO2 95%   BMI 29.55 kg/m    Subjective:    Patient ID: Laura Gibson, female    DOB: 15-Apr-1951, 72 y.o.   MRN: [id number]  HPI: Laura Gibson is a 72 y.o. female presenting on 07/13/2022 for Medicare Wellness and comprehensive medical examination. Current medical complaints include:none  She currently lives with: self Menopausal Symptoms: no  Would like referral to dermatology for lesion removal and assessment.  Ongoing neck pain, follows with Dr. Sharlet Salina and obtains injections -- for arthritis in neck.  They discussed next step would be surgery, which she prefers to avoid.  In August she returns for thyroid ultrasound, Dr, Richardson Landry with ENT follows and she is scheduled for repeat ultrasound on 12/22/22.  Will have ultrasounds once a year.  HYPERTENSION & HLD Continues on Losartan 25 MG daily.  Continues red yeast rice and plant sterols. Hypertension status: stable  Satisfied with current treatment? yes Duration of hypertension: chronic BP monitoring frequency:  rarely -- bought her BP cuff in past year BP range: 175/77 before medications and later in day 148/84 -- after salty meals BP medication side effects:  no Medication compliance: good compliance Aspirin: no Recurrent headaches: no Visual changes: no Palpitations: no Dyspnea: no Chest pain: no Lower extremity edema: no Dizzy/lightheaded: no  The 10-year ASCVD risk score (Arnett DK, et al., 2019) is: 18.5%   Values used to calculate the score:     Age: 85 years     Sex: Female     Is Non-Hispanic African American: No     Diabetic: No     Tobacco smoker: No     Systolic Blood Pressure: [id number] mmHg     Is BP treated: Yes     HDL Cholesterol: 73 mg/dL     Total Cholesterol: 251 mg/dL   OSTEOPENIA Continues  supplements daily. Satisfied with current treatment?: yes Adequate calcium & vitamin D: yes Weight bearing exercises: yes      07/13/2022    2:51 PM 05/10/2022    3:19 PM 07/12/2021    1:13 PM 07/06/2020   10:00 AM 06/28/2019    9:02 AM  Depression screen PHQ 2/9  Decreased Interest 0 0 0 0 0  Down, Depressed, Hopeless 0 0 0 0 0  PHQ - 2 Score 0 0 0 0 0  Altered sleeping '1 1 1 1   '$ Tired, decreased energy 0 0 0 0   Change in appetite 0 0 0 0   Feeling bad or failure about yourself  0 0 0 0   Trouble concentrating 0 0 0 0   Moving slowly or fidgety/restless 0 0 0 0   Suicidal thoughts 0 0 0 0   PHQ-9 Score '1 1 1 1   '$ Difficult doing work/chores Not difficult at all Not difficult at all          07/13/2022    2:52 PM 05/10/2022    3:19 PM 07/12/2021    1:13 PM  GAD 7 : Generalized Anxiety Score  Nervous, Anxious, on Edge 0 0 0  Control/stop worrying 0 0 0  Worry too much - different things 0 0 0  Trouble relaxing 0 0 0  Restless 0  0 0  Easily annoyed or irritable 0 0 0  Afraid - awful might happen 0 0 0  Total GAD 7 Score 0 0 0  Anxiety Difficulty Not difficult at all Not difficult at all Not difficult at all     Functional Status Survey: Is the patient deaf or have difficulty hearing?: No Does the patient have difficulty seeing, even when wearing glasses/contacts?: No Does the patient have difficulty concentrating, remembering, or making decisions?: No Does the patient have difficulty walking or climbing stairs?: No Does the patient have difficulty dressing or bathing?: No Does the patient have difficulty doing errands alone such as visiting a doctor's office or shopping?: No      04/30/2019    8:34 AM 07/06/2020   10:01 AM 07/12/2021    1:09 PM 05/10/2022    3:19 PM 07/13/2022    2:51 PM  [place] in the past year? 0 0 0 0 0  Was there an injury with Fall? 0 0 0 0 0  Fall Risk Category Calculator 0 0 0 0 0  Fall Risk Category (Retired) Low Low Low Low    (RETIRED) Patient Fall Risk Level Low fall risk  Low fall risk    Patient at Risk for Falls Due to   No Fall Risks No Fall Risks No Fall Risks  Fall risk Follow up Falls evaluation completed Falls evaluation completed Falls evaluation completed Falls evaluation completed Falls evaluation completed    Past Medical History:  Past Medical History:  Diagnosis Date   Arthritis of neck    Lichen sclerosus     Surgical History:  Past Surgical History:  Procedure Laterality Date   BUNIONECTOMY     LESION EXCISION     for lichen sclerosus    Medications:  Current Outpatient Medications on File Prior to Visit  Medication Sig   acetaminophen (TYLENOL) 500 MG tablet Take 500 mg by mouth every 6 (six) hours as needed.   calcium-vitamin D (OSCAL WITH D) 250-125 MG-UNIT tablet Take 1 tablet by mouth daily.   CINNAMON PO Take 1 tablet by mouth daily.   co-enzyme Q-10 30 MG capsule Take 1 capsule by mouth daily.   ibuprofen (ADVIL) 200 MG tablet Take 200 mg by mouth every 6 (six) hours as needed.   MAGNESIUM SULFATE PO Take 1 tablet by mouth daily.   Menthol, Topical Analgesic, (ICY HOT) 5 % PTCH Apply topically.   Multiple Vitamins-Minerals (MULTIVITAMIN ADULT PO) Take 1 tablet by mouth daily.   OVER THE COUNTER MEDICATION Beet Root Powder   Plant Sterols and Stanols 450 MG TABS Take 2 tablets by mouth daily.   Red Yeast Rice Extract 600 MG CAPS Take 2 capsules by mouth daily.   No current facility-administered medications on file prior to visit.    Allergies:  No Known Allergies  Social History:  Social History   Socioeconomic History   Marital status: Married    Spouse name: Not on file   Number of children: Not on file   Years of education: Not on file   Highest education level: Not on file  Occupational History   Not on file  Tobacco Use   Smoking status: Never   Smokeless tobacco: Never  Vaping Use   Vaping Use: Never used  Substance and Sexual Activity   Alcohol use:  Yes    Alcohol/week: 7.0 standard drinks of alcohol    Types: 7 Glasses of wine per week   Drug use:  Never   Sexual activity: Yes  Other Topics Concern   Not on file  Social History Narrative   Not on file   Social Determinants of Health   Financial Resource Strain: Low Risk  (07/13/2022)   Overall Financial Resource Strain (CARDIA)    Difficulty of Paying Living Expenses: Not hard at all  Food Insecurity: No Food Insecurity (07/13/2022)   Hunger Vital Sign    Worried About Running Out of Food in the Last Year: Never true    Ran Out of Food in the Last Year: Never true  Transportation Needs: No Transportation Needs (07/13/2022)   PRAPARE - Hydrologist (Medical): No    Lack of Transportation (Non-Medical): No  Physical Activity: Insufficiently Active (07/13/2022)   Exercise Vital Sign    Days of Exercise per Week: 1 day    Minutes of Exercise per Session: 20 min  Stress: No Stress Concern Present (07/13/2022)   [place]    Feeling of Stress : Not at all  Social Connections: Moderately Integrated (07/13/2022)   Social Connection and Isolation Panel [NHANES]    Frequency of Communication with Friends and Family: More than three times a week    Frequency of Social Gatherings with Friends and Family: More than three times a week    Attends Religious Services: More than 4 times per year    Active Member of [employer] or Organizations: No    Attends Archivist Meetings: Never    Marital Status: Married  Human resources officer Violence: Not At Risk (07/13/2022)   Humiliation, Afraid, Rape, and Kick questionnaire    Fear of Current or Ex-Partner: No    Emotionally Abused: No    Physically Abused: No    Sexually Abused: No   Social History   Tobacco Use  Smoking Status Never  Smokeless Tobacco Never   Social History   Substance and Sexual Activity  Alcohol Use Yes   Alcohol/week: 7.0  standard drinks of alcohol   Types: 7 Glasses of wine per week    Family History:  Family History  Problem Relation Age of Onset   Cancer Mother        breast   Osteoporosis Mother    Breast cancer Mother    Heart disease Father    Hypertension Father    Hyperlipidemia Father    Diabetes Sister    Hyperlipidemia Sister    Hyperlipidemia Brother    Diabetes Paternal Grandmother     Past medical history, surgical history, medications, allergies, family history and social history reviewed with patient today and changes made to appropriate areas of the chart.   ROS All other ROS negative except what is listed above and in the HPI.      Objective:    BP 132/88 (BP Location: Left Arm, Patient Position: Sitting, Cuff Size: Normal)   Pulse 86   Temp 97.6 F (36.4 C) (Oral)   Ht 5' 2.76" (1.594 m)   Wt 165 lb 8 oz (75.1 kg)   LMP  (LMP Unknown)   SpO2 95%   BMI 29.55 kg/m   Wt Readings from Last 3 Encounters:  07/13/22 165 lb 8 oz (75.1 kg)  05/10/22 167 lb 6.4 oz (75.9 kg)  07/12/21 166 lb 9.6 oz (75.6 kg)    Physical Exam Vitals and nursing note reviewed. Exam conducted with a chaperone present.  Constitutional:      General: She is  awake. She is not in acute distress.    Appearance: She is well-developed and well-groomed. She is obese. She is not ill-appearing or toxic-appearing.  HENT:     Head: Normocephalic and atraumatic.     Right Ear: Hearing, tympanic membrane, ear canal and external ear normal. No drainage.     Left Ear: Hearing, tympanic membrane, ear canal and external ear normal. No drainage.     Nose: Nose normal.     Right Sinus: No maxillary sinus tenderness or frontal sinus tenderness.     Left Sinus: No maxillary sinus tenderness or frontal sinus tenderness.     Mouth/Throat:     Mouth: Mucous membranes are moist.     Pharynx: Oropharynx is clear. Uvula midline. No pharyngeal swelling, oropharyngeal exudate or posterior oropharyngeal erythema.   Eyes:     General: Lids are normal.        Right eye: No discharge.        Left eye: No discharge.     Extraocular Movements: Extraocular movements intact.     Conjunctiva/sclera: Conjunctivae normal.     Pupils: Pupils are equal, round, and reactive to light.     Visual Fields: Right eye visual fields normal and left eye visual fields normal.  Neck:     Thyroid: No thyromegaly.     Vascular: No carotid bruit.     Trachea: Trachea normal.  Cardiovascular:     Rate and Rhythm: Normal rate and regular rhythm.     Heart sounds: Normal heart sounds. No murmur heard.    No gallop.  Pulmonary:     Effort: Pulmonary effort is normal. No accessory muscle usage or respiratory distress.     Breath sounds: Normal breath sounds.  Chest:  Breasts:    Right: Normal.     Left: Normal.  Abdominal:     General: Bowel sounds are normal.     Palpations: Abdomen is soft. There is no hepatomegaly or splenomegaly.     Tenderness: There is no abdominal tenderness.  Musculoskeletal:        General: Normal range of motion.     Cervical back: Normal range of motion and neck supple.     Right lower leg: No edema.     Left lower leg: No edema.  Lymphadenopathy:     Head:     Right side of head: No submental, submandibular, tonsillar, preauricular or posterior auricular adenopathy.     Left side of head: No submental, submandibular, tonsillar, preauricular or posterior auricular adenopathy.     Cervical: No cervical adenopathy.     Upper Body:     Right upper body: No supraclavicular, axillary or pectoral adenopathy.     Left upper body: No supraclavicular, axillary or pectoral adenopathy.  Skin:    General: Skin is warm and dry.     Capillary Refill: Capillary refill takes less than 2 seconds.     Findings: Lesion present. No rash.       Neurological:     Mental Status: She is alert and oriented to person, place, and time.     Gait: Gait is intact.     Deep Tendon Reflexes: Reflexes are normal  and symmetric.     Reflex Scores:      Brachioradialis reflexes are 2+ on the right side and 2+ on the left side.      Patellar reflexes are 2+ on the right side and 2+ on the left side. Psychiatric:  Attention and Perception: Attention normal.        Mood and Affect: Mood normal.        Speech: Speech normal.        Behavior: Behavior normal. Behavior is cooperative.        Thought Content: Thought content normal.        Judgment: Judgment normal.       07/13/2022    4:42 PM  6CIT Screen  What Year? 0 points  What month? 0 points  What time? 0 points  Count back from 20 0 points  Months in reverse 0 points  Repeat phrase 0 points  Total Score 0 points   Results for orders placed or performed during the hospital encounter of 11/04/21  Cytology - Non PAP;  Result Value Ref Range   CYTOLOGY - NON GYN      CYTOLOGY - NON PAP CASE: [id number] PATIENT: Doriann Sedlar Non-Gynecological Cytology Report     Specimen Submitted: A. Thyroid, right superior; FNA  Clinical History: None provided      DIAGNOSIS: A.  THYROID, RIGHT SUPERIOR, NODULE; FINE-NEEDLE ASPIRATE: - NONDIAGNOSTIC (BETHESDA SYSTEM I ) (SEE COMMENT).  Comment: The multiple aspirate smears are obscured by ultrasound lubricant and blood.  Technically there are 6 groups present but there are degenerated and difficult to evaluate, and therefore do not meet criteria for an adequate specimen.  The slides have been reviewed by additional pathologist who agrees.  GROSS DESCRIPTION: A. Site: Right superior thyroid Procedure: Ultrasound-guided FNA Cytotechnologist(s): Rivka Barbara, [place]man Livings  Specimen material collected and submitted for: 6 diff Quik stained slides 6 Pap stained slides Material for ThyroSeq if needed, labeled with the patients name, date of birth, specimen site, and collection da te ThinPrep: 1  Specimen description: Fixative: Cytoloyt Volume: Approximately 20 mL Color:  Light red Transparency: Clear Tissue fragments present: No  RB 11/04/2021   Final Diagnosis performed by Theodora Blow, MD.   Electronically signed 11/05/2021 4:30:54PM The electronic signature indicates that the named Attending Pathologist has evaluated the specimen Technical component performed at [facility], [address], [place], [place] [address] Lab: [phone] Dir: Rush Farmer, MD, MMM  Professional component performed at [facility], [facility], [place], [place], [place] [address] Lab: [phone] Dir: Kathi Simpers, MD       Assessment & Plan:   Problem List Items Addressed This Visit       Cardiovascular and Mediastinum   Essential hypertension    Chronic, ongoing.  Tolerating Losartan.  BP is improving in office on recheck.  Will continue Losartan at 25 MG and adjust as needed if consistent elevations at home or in office. Educated her on this medication, she is familiar with it as her husband takes this.  Recommend he monitor BP at least a few mornings a week at home and document.  DASH diet at home.  Labs today: CBC, CMP, TSH, urine ALB.        Relevant Medications   losartan (COZAAR) 25 MG tablet   Other Relevant Orders   CBC with Differential/Platelet   Comprehensive metabolic panel   Microalbumin, Urine Waived     Endocrine   Thyroid nodule    Noted on imaging 08/25/21, biopsy performed 11/04/21 with insufficient sample.  Continue collaboration with ENT, she is schedule for repeat imaging in August.  Thyroid labs today.      Relevant Orders   T4, free   TSH     Musculoskeletal and Integument  Arm skin lesion, right    ?irritated skin tag vs basal cell.  She would like referral to dermatology, will place this as she also has other areas of concern, but would like to start out with assessment of lesion to arm.      Relevant Orders   Ambulatory referral to Dermatology   Osteopenia after menopause    Chronic.  Continue supplements,  check Vit D level today.  Next DEXA due in 2025.      Relevant Orders   VITAMIN D 25 Hydroxy (Vit-D Deficiency, Fractures)     Other   Elevated LDL cholesterol level    Chronic, ongoing.  Does not wish to take statin, have discussed with her at length.  Continue supplements and monitor labs.  Lipid panel today.      Relevant Orders   Comprehensive metabolic panel   Lipid Panel w/o Chol/HDL Ratio   Family history of breast cancer    Continue yearly mammograms.      Other Visit Diagnoses     Medicare annual wellness visit, subsequent    -  Primary   Medicare wellness due and performed with patient today.   Encounter for annual physical exam       Annual physical today with labs and health maintenance reviewed, discussed with patient.        Follow up plan: Return in about 6 months (around 01/11/2023) for HTN/HLD, THYROID -- also needs lab visit for fasting tomorrow.   LABORATORY TESTING:  - Pap smear: not applicable  IMMUNIZATIONS:   - Tdap: Tetanus vaccination status reviewed: last tetanus booster within 10 years. - Influenza: Up to date - Pneumovax: Up to date - Prevnar: Up to date - COVID: Up to date - HPV: Not applicable - Shingrix vaccine: Up to date  SCREENING: -Mammogram: Up to date  - Colonoscopy:  wishes to think about it  - Bone Density: Up to date  -Hearing Test: Not applicable  -Spirometry: Not applicable   PATIENT COUNSELING:   Advised to take 1 mg of folate supplement per day if capable of pregnancy.   Sexuality: Discussed sexually transmitted diseases, partner selection, use of condoms, avoidance of unintended pregnancy  and contraceptive alternatives.   Advised to avoid cigarette smoking.  I discussed with the patient that most people either abstain from alcohol or drink within safe limits (<=14/week and <=4 drinks/occasion for males, <=7/weeks and <= 3 drinks/occasion for females) and that the risk for alcohol disorders and other health effects  rises proportionally with the number of drinks per week and how often a drinker exceeds daily limits.  Discussed cessation/primary prevention of drug use and availability of treatment for abuse.   Diet: Encouraged to adjust caloric intake to maintain  or achieve ideal body weight, to reduce intake of dietary saturated fat and total fat, to limit sodium intake by avoiding high sodium foods and not adding table salt, and to maintain adequate dietary potassium and calcium preferably from fresh fruits, vegetables, and low-fat dairy products.    Stressed the importance of regular exercise  Injury prevention: Discussed safety belts, safety helmets, smoke detector, smoking near bedding or upholstery.   Dental health: Discussed importance of regular tooth brushing, flossing, and dental visits.    NEXT PREVENTATIVE PHYSICAL DUE IN 1 YEAR. Return in about 6 months (around 01/11/2023) for HTN/HLD, THYROID -- also needs lab visit for fasting tomorrow.

## 2022-07-13 NOTE — Assessment & Plan Note (Signed)
Noted on imaging 08/25/21, biopsy performed 11/04/21 with insufficient sample.  Continue collaboration with ENT, she is schedule for repeat imaging in August.  Thyroid labs today.

## 2022-07-13 NOTE — Assessment & Plan Note (Signed)
Continue yearly mammograms 

## 2022-07-13 NOTE — Assessment & Plan Note (Signed)
Chronic, ongoing.  Tolerating Losartan.  BP is improving in office on recheck.  Will continue Losartan at 25 MG and adjust as needed if consistent elevations at home or in office. Educated her on this medication, she is familiar with it as her husband takes this.  Recommend he monitor BP at least a few mornings a week at home and document.  DASH diet at home.  Labs today: CBC, CMP, TSH, urine ALB.

## 2022-07-14 ENCOUNTER — Other Ambulatory Visit: Payer: Medicare Other

## 2022-07-14 DIAGNOSIS — E7800 Pure hypercholesterolemia, unspecified: Secondary | ICD-10-CM | POA: Diagnosis not present

## 2022-07-14 DIAGNOSIS — I10 Essential (primary) hypertension: Secondary | ICD-10-CM | POA: Diagnosis not present

## 2022-07-14 DIAGNOSIS — M8580 Other specified disorders of bone density and structure, unspecified site: Secondary | ICD-10-CM

## 2022-07-14 DIAGNOSIS — E041 Nontoxic single thyroid nodule: Secondary | ICD-10-CM | POA: Diagnosis not present

## 2022-07-14 DIAGNOSIS — Z780 Asymptomatic menopausal state: Secondary | ICD-10-CM | POA: Diagnosis not present

## 2022-07-14 LAB — MICROALBUMIN, URINE WAIVED
Creatinine, Urine Waived: 10 mg/dL (ref 10–300)
Microalb, Ur Waived: 10 mg/L (ref 0–19)

## 2022-07-15 LAB — COMPREHENSIVE METABOLIC PANEL
ALT: 17 IU/L (ref 0–32)
AST: 17 IU/L (ref 0–40)
Albumin/Globulin Ratio: 1.7 (ref 1.2–2.2)
Albumin: 4.4 g/dL (ref 3.8–4.8)
Alkaline Phosphatase: 82 IU/L (ref 44–121)
BUN/Creatinine Ratio: 19 (ref 12–28)
BUN: 15 mg/dL (ref 8–27)
Bilirubin Total: 0.3 mg/dL (ref 0.0–1.2)
CO2: 25 mmol/L (ref 20–29)
Calcium: 9.7 mg/dL (ref 8.7–10.3)
Chloride: 103 mmol/L (ref 96–106)
Creatinine, Ser: 0.79 mg/dL (ref 0.57–1.00)
Globulin, Total: 2.6 g/dL (ref 1.5–4.5)
Glucose: 106 mg/dL — ABNORMAL HIGH (ref 70–99)
Potassium: 4.5 mmol/L (ref 3.5–5.2)
Sodium: 140 mmol/L (ref 134–144)
Total Protein: 7 g/dL (ref 6.0–8.5)
eGFR: 79 mL/min/{1.73_m2} (ref >59)

## 2022-07-15 LAB — CBC WITH DIFFERENTIAL/PLATELET
Basophils Absolute: 0.1 10*3/uL (ref 0.0–0.2)
Basos: 1 %
EOS (ABSOLUTE): 0.3 10*3/uL (ref 0.0–0.4)
Eos: 6 %
Hematocrit: 45.7 % (ref 34.0–46.6)
Hemoglobin: 15.1 g/dL (ref 11.1–15.9)
Immature Grans (Abs): 0 10*3/uL (ref 0.0–0.1)
Immature Granulocytes: 0 %
Lymphocytes Absolute: 1.6 10*3/uL (ref 0.7–3.1)
Lymphs: 31 %
MCH: 30.1 pg (ref 26.6–33.0)
MCHC: 33 g/dL (ref 31.5–35.7)
MCV: 91 fL (ref 79–97)
Monocytes Absolute: 0.4 10*3/uL (ref 0.1–0.9)
Monocytes: 8 %
Neutrophils Absolute: 2.7 10*3/uL (ref 1.4–7.0)
Neutrophils: 54 %
Platelets: 263 10*3/uL (ref 150–450)
RBC: 5.01 x10E6/uL (ref 3.77–5.28)
RDW: 12.9 % (ref 11.7–15.4)
WBC: 5.1 10*3/uL (ref 3.4–10.8)

## 2022-07-15 LAB — LIPID PANEL W/O CHOL/HDL RATIO
Cholesterol, Total: 254 mg/dL — ABNORMAL HIGH (ref 100–199)
HDL: 63 mg/dL (ref >39)
LDL Chol Calc (NIH): 161 mg/dL — ABNORMAL HIGH (ref 0–99)
Triglycerides: 166 mg/dL — ABNORMAL HIGH (ref 0–149)
VLDL Cholesterol Cal: 30 mg/dL (ref 5–40)

## 2022-07-15 LAB — T4, FREE: Free T4: 1.46 ng/dL (ref 0.82–1.77)

## 2022-07-15 LAB — VITAMIN D 25 HYDROXY (VIT D DEFICIENCY, FRACTURES): Vit D, 25-Hydroxy: 39.9 ng/mL (ref 30.0–100.0)

## 2022-07-15 LAB — TSH: TSH: 2.4 u[IU]/mL (ref 0.450–4.500)

## 2022-07-15 NOTE — Progress Notes (Signed)
Contacted via MyChart   Good morning Laura Gibson your labs have returned and overall look fantastic with exception of cholesterol levels.  I know you prefer no medication for this, so recommend heavy focus on diet -- even looking into [facility] [place] which is a more plant-based diet.  You could add fish and chicken to this thought.  Statin therapy would be beneficial to help lower stroke risk, but diet changes can help too.  Let me know your thoughts:) Keep being amazing!!  Thank you for allowing me to participate in your care.  I appreciate you. Kindest regards, Tayloranne Lekas

## 2022-07-20 ENCOUNTER — Ambulatory Visit: Payer: Medicare Other | Admitting: Dermatology

## 2022-07-20 DIAGNOSIS — D485 Neoplasm of uncertain behavior of skin: Secondary | ICD-10-CM

## 2022-07-20 DIAGNOSIS — L820 Inflamed seborrheic keratosis: Secondary | ICD-10-CM | POA: Diagnosis not present

## 2022-07-20 DIAGNOSIS — L821 Other seborrheic keratosis: Secondary | ICD-10-CM | POA: Diagnosis not present

## 2022-07-20 DIAGNOSIS — L570 Actinic keratosis: Secondary | ICD-10-CM | POA: Diagnosis not present

## 2022-07-20 DIAGNOSIS — D224 Melanocytic nevi of scalp and neck: Secondary | ICD-10-CM | POA: Diagnosis not present

## 2022-07-20 DIAGNOSIS — L578 Other skin changes due to chronic exposure to nonionizing radiation: Secondary | ICD-10-CM | POA: Diagnosis not present

## 2022-07-20 DIAGNOSIS — D492 Neoplasm of unspecified behavior of bone, soft tissue, and skin: Secondary | ICD-10-CM

## 2022-07-20 NOTE — Progress Notes (Signed)
New Patient Visit  Subjective  Laura Gibson is a 72 y.o. female who presents for the following: Skin Problem (Spot at right arm that is new and has bled. Present for maybe 3 weeks. Spot under right eye, irritating for patient, present for a long time. ).  No personal or family hx skin cancer.   The following portions of the chart were reviewed this encounter and updated as appropriate:   Tobacco  Allergies  Meds  Problems  Med Hx  Surg Hx  Fam Hx      Review of Systems:  No other skin or systemic complaints except as noted in HPI or Assessment and Plan.  Objective  Well appearing patient in no apparent distress; mood and affect are within normal limits.  A focused examination was performed including arms, face. Relevant physical exam findings are noted in the Assessment and Plan.  Right Ventral Forearm 0.6 cm verrucous friable papule R/o SCC vs Verruca     Right Neck 0.7 cm erythematous pink papule R/o Irritated Nevus vs Other     left cheek Erythematous thin papules/macules with gritty scale.   Right Lower Eyelid x 2, left cheek x 1 (3) Erythematous stuck-on, waxy papule or plaque    Assessment & Plan  Neoplasm of skin (2) Right Ventral Forearm  Skin / nail biopsy Type of biopsy: tangential   Informed consent: discussed and consent obtained   Timeout: patient name, date of birth, surgical site, and procedure verified   Procedure prep:  Patient was prepped and draped in usual sterile fashion Prep type:  Isopropyl alcohol Anesthesia: the lesion was anesthetized in a standard fashion   Anesthetic:  1% lidocaine w/ epinephrine 1-100,000 buffered w/ 8.4% NaHCO3 Instrument used: DermaBlade   Hemostasis achieved with: aluminum chloride   Hemostasis achieved with comment:  Bleed Stop powder Outcome: patient tolerated procedure well   Post-procedure details: wound care instructions given   Additional details:  Petrolatum and a pressure bandage  applied  Specimen 1 - Surgical pathology Differential Diagnosis: R/o SCC vs Verruca  Check Margins: No 0.6 cm verrucous friable papule   Right Neck  Epidermal / dermal shaving  Lesion diameter (cm):  0.7 Informed consent: discussed and consent obtained   Timeout: patient name, date of birth, surgical site, and procedure verified   Anesthesia: the lesion was anesthetized in a standard fashion   Anesthetic:  1% lidocaine w/ epinephrine 1-100,000 local infiltration Instrument used: #15 blade   Hemostasis achieved with: aluminum chloride and electrodesiccation   Outcome: patient tolerated procedure well   Post-procedure details: wound care instructions given   Additional details:  Mupirocin and a bandage applied  Specimen 2 - Surgical pathology Differential Diagnosis: R/o Irritated Nevus vs Other  Check Margins: No 0.7 cm erythematous pink papule   Plan LN2 if + for wart Plan EDC if + for skin CA  AK (actinic keratosis) left cheek  Actinic keratoses are precancerous spots that appear secondary to cumulative UV radiation exposure/sun exposure over time. They are chronic with expected duration over 1 year. A portion of actinic keratoses will progress to squamous cell carcinoma of the skin. It is not possible to reliably predict which spots will progress to skin cancer and so treatment is recommended to prevent development of skin cancer.  Recommend daily broad spectrum sunscreen SPF 30+ to sun-exposed areas, reapply every 2 hours as needed.  Recommend staying in the shade or wearing long sleeves, sun glasses (UVA+UVB protection) and wide brim hats (  4-inch brim around the entire circumference of the hat). Call for new or changing lesions.  Prior to procedure, discussed risks of blister formation, small wound, skin dyspigmentation, or rare scar following cryotherapy. Recommend Vaseline ointment to treated areas while healing.    Destruction of lesion - left cheek  Destruction  method: cryotherapy   Informed consent: discussed and consent obtained   Lesion destroyed using liquid nitrogen: Yes   Cryotherapy cycles:  2 Outcome: patient tolerated procedure well with no complications   Post-procedure details: wound care instructions given    Inflamed seborrheic keratosis (3) Right Lower Eyelid x 2, left cheek x 1  Symptomatic, irritating, patient would like treated.  Benign-appearing.  Call clinic for new or changing lesions.   Prior to procedure, discussed risks of blister formation, small wound, skin dyspigmentation, or rare scar following treatment. Recommend Vaseline ointment to treated areas while healing.    Destruction of lesion - Right Lower Eyelid x 2, left cheek x 1  Destruction method: cryotherapy   Informed consent: discussed and consent obtained   Lesion destroyed using liquid nitrogen: Yes   Cryotherapy cycles:  2 Outcome: patient tolerated procedure well with no complications   Post-procedure details: wound care instructions given    Seborrheic Keratoses - Stuck-on, waxy, tan-brown papules and/or plaques  - Benign-appearing - Discussed benign etiology and prognosis. - Observe - Call for any changes  Actinic Damage - chronic, secondary to cumulative UV radiation exposure/sun exposure over time - diffuse scaly erythematous macules with underlying dyspigmentation - Recommend daily broad spectrum sunscreen SPF 30+ to sun-exposed areas, reapply every 2 hours as needed.  - Recommend staying in the shade or wearing long sleeves, sun glasses (UVA+UVB protection) and wide brim hats (4-inch brim around the entire circumference of the hat). - Call for new or changing lesions.   Return for 3-4 month , TBSE, AK follow up.  Graciella Belton, RMA, am acting as scribe for Forest Gleason, MD .  Documentation: I have reviewed the above documentation for accuracy and completeness, and I agree with the above.  Forest Gleason, MD

## 2022-07-20 NOTE — Patient Instructions (Addendum)
Wound Care Instructions  Cleanse wound gently with soap and water once a day then pat dry with clean gauze. Apply a thin coat of Petrolatum (petroleum jelly, "Vaseline") over the wound (unless you have an allergy to this). We recommend that you use a new, sterile tube of Vaseline. Do not pick or remove scabs. Do not remove the yellow or white "healing tissue" from the base of the wound.  Cover the wound with fresh, clean, nonstick gauze and secure with paper tape. You may use Band-Aids in place of gauze and tape if the wound is small enough, but would recommend trimming much of the tape off as there is often too much. Sometimes Band-Aids can irritate the skin.  You should call the office for your biopsy report after 1 week if you have not already been contacted.  If you experience any problems, such as abnormal amounts of bleeding, swelling, significant bruising, significant pain, or evidence of infection, please call the office immediately.  FOR ADULT SURGERY PATIENTS: If you need something for pain relief you may take 1 extra strength Tylenol (acetaminophen) AND 2 Ibuprofen (200mg each) together every 4 hours as needed for pain. (do not take these if you are allergic to them or if you have a reason you should not take them.) Typically, you may only need pain medication for 1 to 3 days.     Cryotherapy Aftercare  Wash gently with soap and water everyday.   Apply Vaseline and Band-Aid daily until healed.    Due to recent changes in healthcare laws, you may see results of your pathology and/or laboratory studies on MyChart before the doctors have had a chance to review them. We understand that in some cases there may be results that are confusing or concerning to you. Please understand that not all results are received at the same time and often the doctors may need to interpret multiple results in order to provide you with the best plan of care or course of treatment. Therefore, we ask that you  please give us 2 business days to thoroughly review all your results before contacting the office for clarification. Should we see a critical lab result, you will be contacted sooner.   If You Need Anything After Your Visit  If you have any questions or concerns for your doctor, please call our main line at [phone] and press option 4 to reach your doctor's medical assistant. If no one answers, please leave a voicemail as directed and we will return your call as soon as possible. Messages left after 4 pm will be answered the following business day.   You may also send us a message via MyChart. We typically respond to MyChart messages within 1-2 business days.  For prescription refills, please ask your pharmacy to contact our office. Our fax number is [phone].  If you have an urgent issue when the clinic is closed that cannot wait until the next business day, you can page your doctor at the number below.    Please note that while we do our best to be available for urgent issues outside of office hours, we are not available 24/7.   If you have an urgent issue and are unable to reach us, you may choose to seek medical care at your doctor's office, retail clinic, urgent care center, or emergency room.  If you have a medical emergency, please immediately call 911 or go to the emergency department.  Pager Numbers  - Dr. Kowalski: [phone]  -   Dr. Moye: [phone]  - Dr. Stewart: [phone]  In the event of inclement weather, please call our main line at [phone] for an update on the status of any delays or closures.  Dermatology Medication Tips: Please keep the boxes that topical medications come in in order to help keep track of the instructions about where and how to use these. Pharmacies typically print the medication instructions only on the boxes and not directly on the medication tubes.   If your medication is too expensive, please contact our office at  [phone] option 4 or send us a message through MyChart.   We are unable to tell what your co-pay for medications will be in advance as this is different depending on your insurance coverage. However, we may be able to find a substitute medication at lower cost or fill out paperwork to get insurance to cover a needed medication.   If a prior authorization is required to get your medication covered by your insurance company, please allow us 1-2 business days to complete this process.  Drug prices often vary depending on where the prescription is filled and some pharmacies may offer cheaper prices.  The website www.goodrx.com contains coupons for medications through different pharmacies. The prices here do not account for what the cost may be with help from insurance (it may be cheaper with your insurance), but the website can give you the price if you did not use any insurance.  - You can print the associated coupon and take it with your prescription to the pharmacy.  - You may also stop by our office during regular business hours and pick up a GoodRx coupon card.  - If you need your prescription sent electronically to a different pharmacy, notify our office through [place] MyChart or by phone at [phone] option 4.     Si Usted Necesita Algo Despus de Su Visita  Tambin puede enviarnos un mensaje a travs de MyChart. Por lo general respondemos a los mensajes de MyChart en el transcurso de 1 a 2 das hbiles.  Para renovar recetas, por favor pida a su farmacia que se ponga en contacto con nuestra oficina. Nuestro nmero de fax es el [phone].  Si tiene un asunto urgente cuando la clnica est cerrada y que no puede esperar hasta el siguiente da hbil, puede llamar/localizar a su doctor(a) al nmero que aparece a continuacin.   Por favor, tenga en cuenta que aunque hacemos todo lo posible para estar disponibles para asuntos urgentes fuera del horario de oficina, no estamos  disponibles las 24 horas del da, los 7 das de la semana.   Si tiene un problema urgente y no puede comunicarse con nosotros, puede optar por buscar atencin mdica  en el consultorio de su doctor(a), en una clnica privada, en un centro de atencin urgente o en una sala de emergencias.  Si tiene una emergencia mdica, por favor llame inmediatamente al 911 o vaya a la sala de emergencias.  Nmeros de bper  - Dr. Kowalski: [phone]  - Dra. Moye: [phone]  - Dra. Stewart: [phone]  En caso de inclemencias del tiempo, por favor llame a nuestra lnea principal al [phone] para una actualizacin sobre el estado de cualquier retraso o cierre.  Consejos para la medicacin en dermatologa: Por favor, guarde las cajas en las que vienen los medicamentos de uso tpico para ayudarle a seguir las instrucciones sobre dnde y cmo usarlos. Las farmacias generalmente imprimen las instrucciones del medicamento slo en las cajas y   no directamente en los tubos del medicamento.   Si su medicamento es muy caro, por favor, pngase en contacto con nuestra oficina llamando al [phone] y presione la opcin 4 o envenos un mensaje a travs de MyChart.   No podemos decirle cul ser su copago por los medicamentos por adelantado ya que esto es diferente dependiendo de la cobertura de su seguro. Sin embargo, es posible que podamos encontrar un medicamento sustituto a menor costo o llenar un formulario para que el seguro cubra el medicamento que se considera necesario.   Si se requiere una autorizacin previa para que su compaa de seguros cubra su medicamento, por favor permtanos de 1 a 2 das hbiles para completar este proceso.  Los precios de los medicamentos varan con frecuencia dependiendo del lugar de dnde se surte la receta y alguna farmacias pueden ofrecer precios ms baratos.  El sitio web www.goodrx.com tiene cupones para medicamentos de diferentes farmacias. Los precios aqu no  tienen en cuenta lo que podra costar con la ayuda del seguro (puede ser ms barato con su seguro), pero el sitio web puede darle el precio si no utiliz ningn seguro.  - Puede imprimir el cupn correspondiente y llevarlo con su receta a la farmacia.  - Tambin puede pasar por nuestra oficina durante el horario de atencin regular y recoger una tarjeta de cupones de GoodRx.  - Si necesita que su receta se enve electrnicamente a una farmacia diferente, informe a nuestra oficina a travs de MyChart de [place] o por telfono llamando al [phone] y presione la opcin 4.  

## 2022-07-23 ENCOUNTER — Encounter: Payer: Self-pay | Admitting: Dermatology

## 2022-07-28 ENCOUNTER — Telehealth: Payer: Self-pay

## 2022-07-28 NOTE — Telephone Encounter (Signed)
-----   Message from Alfonso Patten, MD sent at 07/27/2022  5:20 PM EDT ----- 1. 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 Skin , right neck MELANOCYTIC NEVUS, INTRADERMAL TYPE -->  This is a NORMAL MOLE. No additional treatment is needed. If you notice any new or changing spots or have other skin concerns in future, please call our office at [phone].     MAs please call. Thank you!

## 2022-07-28 NOTE — Telephone Encounter (Signed)
Discussed pathology results. Patient voiced understanding. Appt for LN2 scheduled for 09/08/2022.

## 2022-08-25 DIAGNOSIS — M4802 Spinal stenosis, cervical region: Secondary | ICD-10-CM | POA: Diagnosis not present

## 2022-08-25 DIAGNOSIS — M5412 Radiculopathy, cervical region: Secondary | ICD-10-CM | POA: Diagnosis not present

## 2022-08-25 DIAGNOSIS — M62838 Other muscle spasm: Secondary | ICD-10-CM | POA: Diagnosis not present

## 2022-09-02 IMAGING — MG MM DIGITAL SCREENING BILAT W/ TOMO AND CAD
8 series · 8 of 24 positions shown · non-contrast
Comparison: Previous exam(s).

CLINICAL DATA: Screening.

EXAM:
DIGITAL SCREENING BILATERAL MAMMOGRAM WITH TOMOSYNTHESIS AND CAD
TECHNIQUE: Bilateral screening digital craniocaudal and mediolateral oblique
mammograms were obtained. Bilateral screening digital breast
tomosynthesis was performed. The images were evaluated with
computer-aided detection.

[R CC synth-2D]
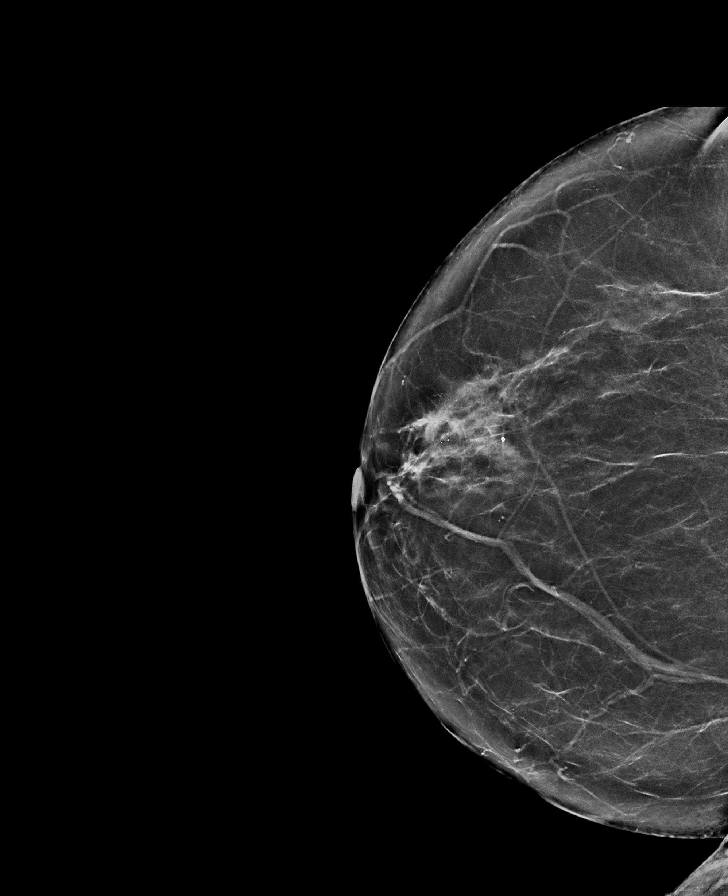

[L MLO synth-2D]
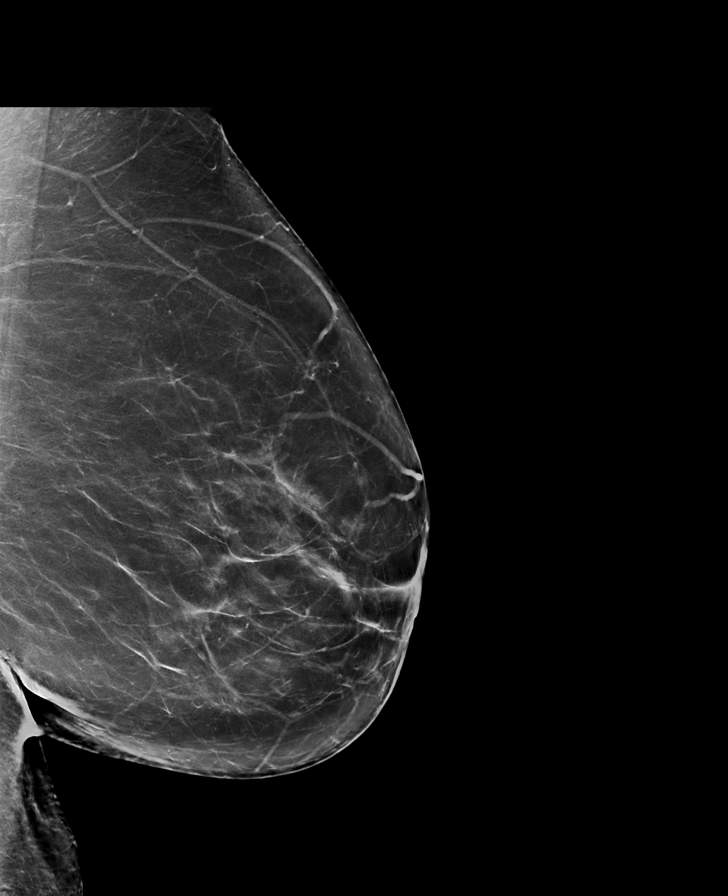

[R MLO synth-2D]
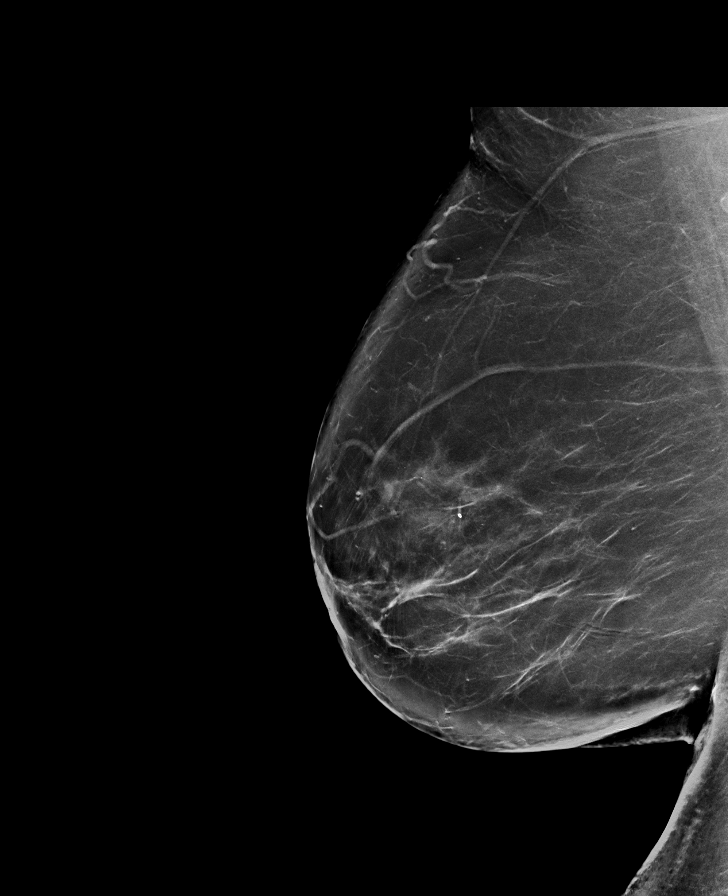

[L CC synth-2D]
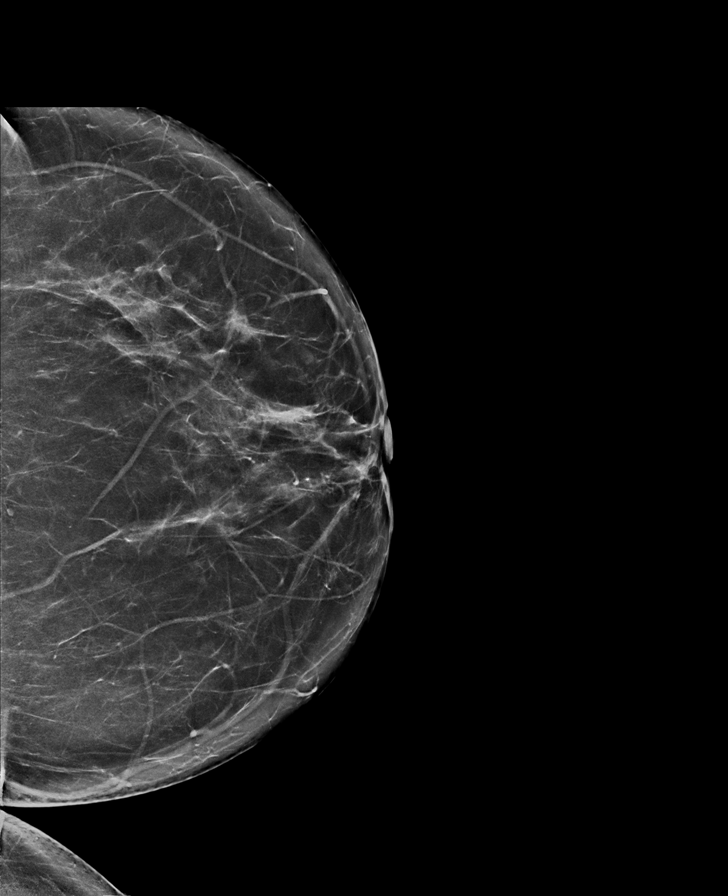

[R MLO tomo · tomo slice 46/91.0]
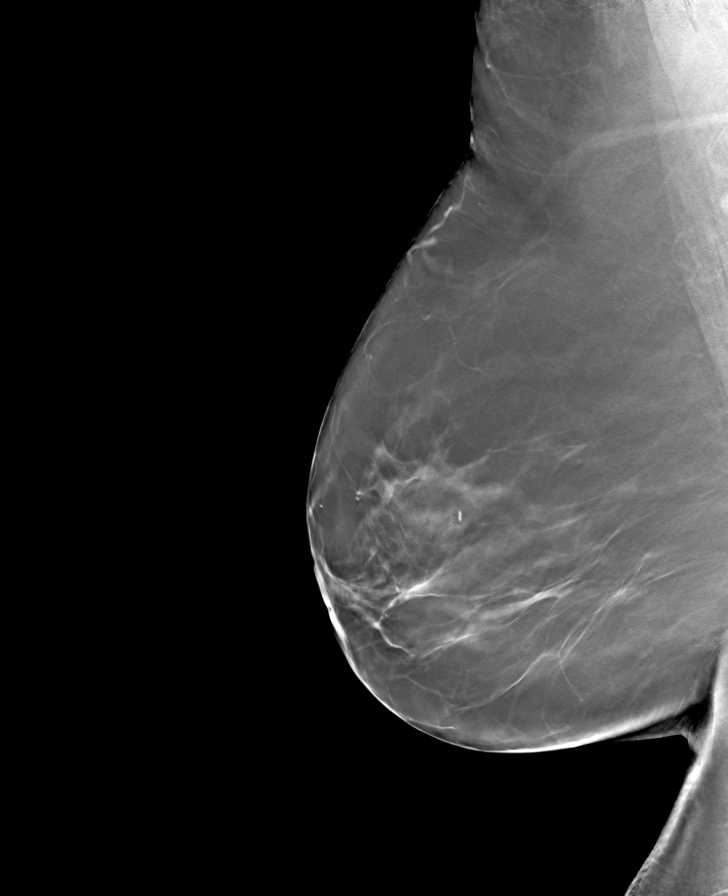

[R CC tomo · tomo slice 37/72.0]
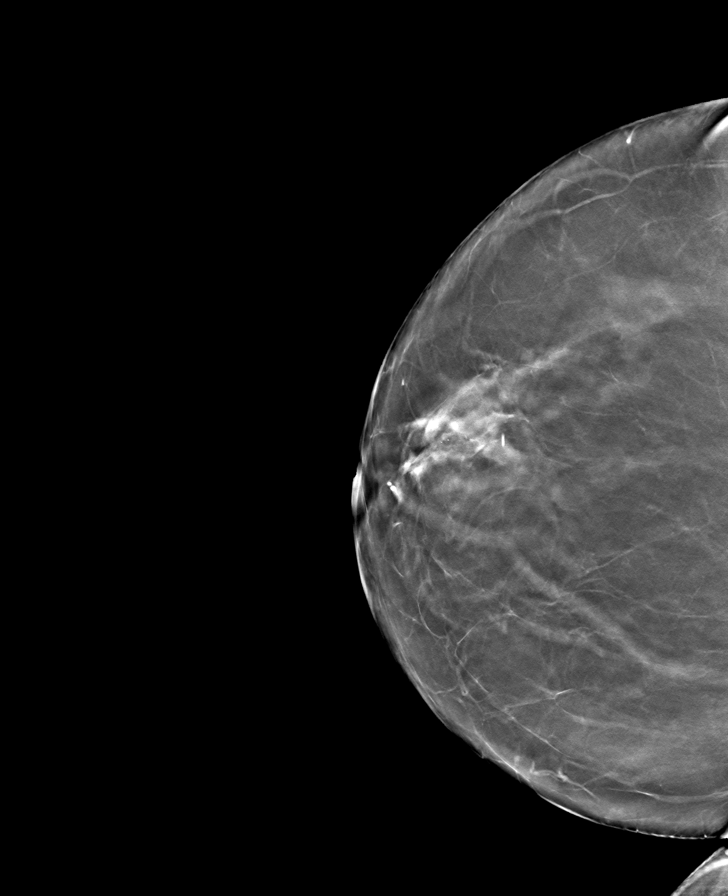

[L MLO tomo · tomo slice 44/87.0]
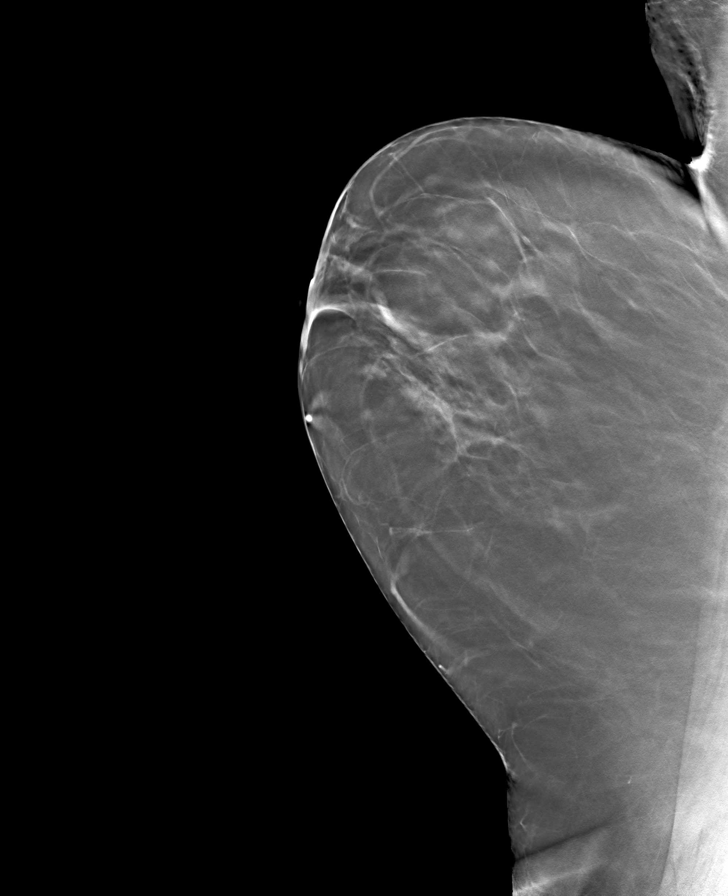

[L CC tomo · tomo slice 37/74.0]
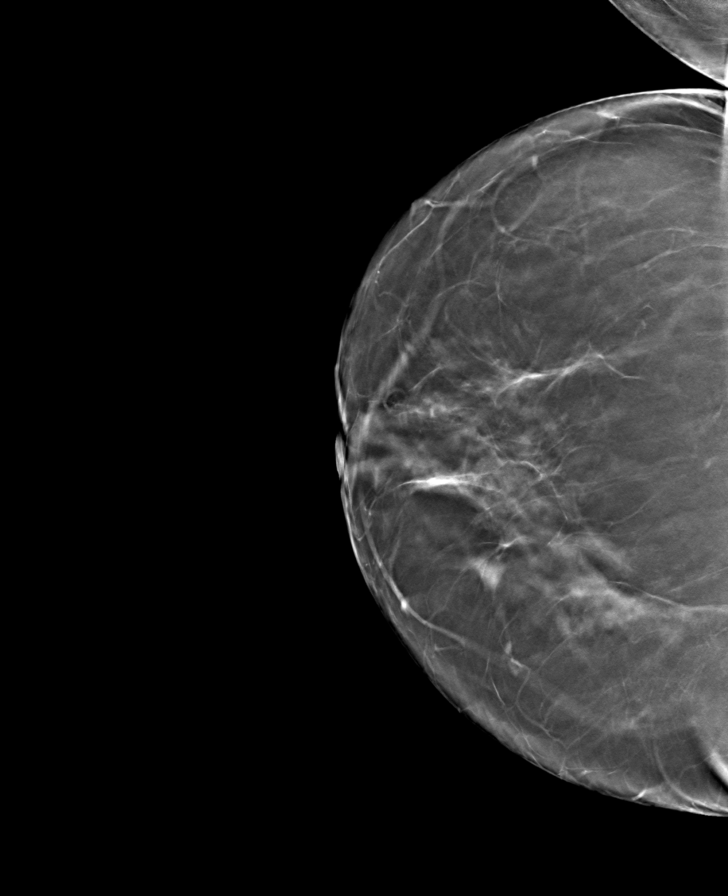

[8 of 24 positions shown; findings below may reference images not displayed]

ACR Breast Density Category b: There are scattered areas of
fibroglandular density.
FINDINGS: There are no findings suspicious for malignancy.
IMPRESSION: No mammographic evidence of malignancy. A result letter of this
screening mammogram will be mailed directly to the patient.

RECOMMENDATION:
Screening mammogram in one year. (Code:51-O-LD2)

BI-RADS CATEGORY  1: Negative.

## 2022-09-07 ENCOUNTER — Ambulatory Visit: Payer: Medicare Other | Admitting: Dermatology

## 2022-09-07 ENCOUNTER — Encounter: Payer: Self-pay | Admitting: Dermatology

## 2022-09-07 VITALS — BP 175/96 | HR 84

## 2022-09-07 DIAGNOSIS — L905 Scar conditions and fibrosis of skin: Secondary | ICD-10-CM | POA: Diagnosis not present

## 2022-09-07 DIAGNOSIS — L578 Other skin changes due to chronic exposure to nonionizing radiation: Secondary | ICD-10-CM

## 2022-09-07 DIAGNOSIS — Z872 Personal history of diseases of the skin and subcutaneous tissue: Secondary | ICD-10-CM

## 2022-09-07 DIAGNOSIS — L820 Inflamed seborrheic keratosis: Secondary | ICD-10-CM | POA: Diagnosis not present

## 2022-09-07 DIAGNOSIS — L720 Epidermal cyst: Secondary | ICD-10-CM | POA: Diagnosis not present

## 2022-09-07 DIAGNOSIS — D224 Melanocytic nevi of scalp and neck: Secondary | ICD-10-CM

## 2022-09-07 DIAGNOSIS — D229 Melanocytic nevi, unspecified: Secondary | ICD-10-CM

## 2022-09-07 NOTE — Patient Instructions (Addendum)
Scar on neck: Recommend Serica moisturizing scar formula cream every night or [facility] brand or Mederma silicone scar sheet every night for the first year after a scar appears to help with scar remodeling if desired. Scars remodel on their own for a full year and will gradually improve in appearance over time.   Seborrheic keratoses: Apply diclofenac (voltaren) gel twice a day to spots on face.    Cryotherapy Aftercare  Wash gently with soap and water everyday.   Apply Vaseline Jelly daily until healed.   Due to recent changes in healthcare laws, you may see results of your pathology and/or laboratory studies on MyChart before the doctors have had a chance to review them. We understand that in some cases there may be results that are confusing or concerning to you. Please understand that not all results are received at the same time and often the doctors may need to interpret multiple results in order to provide you with the best plan of care or course of treatment. Therefore, we ask that you please give Laura Gibson 2 business days to thoroughly review all your results before contacting the office for clarification. Should we see a critical lab result, you will be contacted sooner.   If You Need Anything After Your Visit  If you have any questions or concerns for your doctor, please call our main line at [phone] and press option 4 to reach your doctor's medical assistant. If no one answers, please leave a voicemail as directed and we will return your call as soon as possible. Messages left after 4 pm will be answered the following business day.   You may also send Laura Gibson a message via MyChart. We typically respond to MyChart messages within 1-2 business days.  For prescription refills, please ask your pharmacy to contact our office. Our fax number is [phone].  If you have an urgent issue when the clinic is closed that cannot wait until the next business day, you can page your doctor at the  number below.    Please note that while we do our best to be available for urgent issues outside of office hours, we are not available 24/7.   If you have an urgent issue and are unable to reach Laura Gibson, you may choose to seek medical care at your doctor's office, retail clinic, urgent care center, or emergency room.  If you have a medical emergency, please immediately call 911 or go to the emergency department.  Pager Numbers  - Dr. Gwen Pounds: [phone]  - Dr. Neale Burly: [phone]  - Dr. Roseanne Reno: [phone]  In the event of inclement weather, please call our main line at [phone] for an update on the status of any delays or closures.  Dermatology Medication Tips: Please keep the boxes that topical medications come in in order to help keep track of the instructions about where and how to use these. Pharmacies typically print the medication instructions only on the boxes and not directly on the medication tubes.   If your medication is too expensive, please contact our office at [phone] option 4 or send Laura Gibson a message through MyChart.   We are unable to tell what your co-pay for medications will be in advance as this is different depending on your insurance coverage. However, we may be able to find a substitute medication at lower cost or fill out paperwork to get insurance to cover a needed medication.   If a prior authorization is required to get your medication covered by your insurance  company, please allow Laura Gibson 1-2 business days to complete this process.  Drug prices often vary depending on where the prescription is filled and some pharmacies may offer cheaper prices.  The website www.goodrx.com contains coupons for medications through different pharmacies. The prices here do not account for what the cost may be with help from insurance (it may be cheaper with your insurance), but the website can give you the price if you did not use any insurance.  - You can print the associated  coupon and take it with your prescription to the pharmacy.  - You may also stop by our office during regular business hours and pick up a GoodRx coupon card.  - If you need your prescription sent electronically to a different pharmacy, notify our office through [facility] or by phone at [phone] option 4.     Si Usted Necesita Algo Despus de Su Visita  Tambin puede enviarnos un mensaje a travs de Pharmacist, community. Por lo general respondemos a los mensajes de MyChart en el transcurso de 1 a 2 das hbiles.  Para renovar recetas, por favor pida a su farmacia que se ponga en contacto con nuestra oficina. Harland Dingwall de fax es [place] [phone].  Si tiene un asunto urgente cuando la clnica est cerrada y que no puede esperar hasta el siguiente da hbil, puede llamar/localizar a su doctor(a) al nmero que aparece a continuacin.   Por favor, tenga en cuenta que aunque hacemos todo lo posible para estar disponibles para asuntos urgentes fuera del horario de [place], no estamos disponibles las 24 horas del da, los 7 das de la [place].   Si tiene un problema urgente y no puede comunicarse con nosotros, puede optar por buscar atencin mdica  en el consultorio de su doctor(a), en una clnica privada, en un centro de atencin urgente o en una sala de emergencias.  Si tiene Engineering geologist, por favor llame inmediatamente al 911 o vaya a la sala de emergencias.  Nmeros de bper  - Dr. Nehemiah Massed: [phone]  - Dra. Moye: [phone]  - Dra. Nicole Kindred: [phone]  En caso de inclemencias del [place], por favor llame a Johnsie Kindred principal al [phone] para una actualizacin sobre el [place] de cualquier retraso o cierre.  Consejos para la medicacin en dermatologa: Por favor, guarde las cajas en las que vienen los medicamentos de uso tpico para ayudarle a seguir las instrucciones sobre dnde y cmo usarlos. Las farmacias generalmente imprimen las instrucciones del  medicamento slo en las cajas y no directamente en los tubos del [place].   Si su medicamento es muy caro, por favor, pngase en contacto con Zigmund Daniel llamando al [phone] y presione la opcin 4 o envenos un mensaje a travs de Pharmacist, community.   No podemos decirle cul ser su copago por los medicamentos por adelantado ya que esto es diferente dependiendo de la cobertura de su seguro. Sin embargo, es posible que podamos encontrar un medicamento sustituto a Electrical engineer un formulario para que el seguro cubra el medicamento que se considera necesario.   Si se requiere una autorizacin previa para que su compaa de seguros [place] su medicamento, por favor permtanos de 1 a 2 das hbiles para completar este proceso.  Los precios de los medicamentos varan con frecuencia dependiendo del Environmental consultant de dnde se surte la receta y alguna farmacias pueden ofrecer precios ms baratos.  El sitio web www.goodrx.com tiene cupones para medicamentos de Airline pilot. Los precios aqu no tienen en cuenta  lo que podra costar con la ayuda del seguro (puede ser ms barato con su seguro), pero el sitio web puede darle el precio si no utiliz ningn seguro.  - Puede imprimir el cupn correspondiente y llevarlo con su receta a la farmacia.  - Tambin puede pasar por nuestra oficina durante el horario de atencin regular y recoger una tarjeta de cupones de GoodRx.  - Si necesita que su receta se enve electrnicamente a una farmacia diferente, informe a nuestra oficina a travs de MyChart de [place] o por telfono llamando al [phone] y presione la opcin 4.  

## 2022-09-07 NOTE — Progress Notes (Signed)
   Follow-Up Visit   Subjective  Laura Gibson is a 72 y.o. female who presents for the following: Actinic keratosis, biopsy proven 07/20/2022. Here for LN2 treatment. Right ventral forearm. Recheck lesion on left cheek. Has not resolved since LN2 treatment 6 weeks ago.  Check lesion on right side of neck. Rubbed, irritated.   The following portions of the chart were reviewed this encounter and updated as appropriate: medications, allergies, medical history  Review of Systems:  No other skin or systemic complaints except as noted in HPI or Assessment and Plan.  Objective  Well appearing patient in no apparent distress; mood and affect are within normal limits.  A focused examination was performed of the following areas: Face, right arm  Relevant exam findings are noted in the Assessment and Plan.    Assessment & Plan   MELANOCYTIC NEVUS Exam: pink-flesh-colored symmetric papule at right neck, adjacent to scar.  Treatment Plan: Benign appearing on exam today. Recommend observation. Call clinic for new or changing moles. Recommend daily use of broad spectrum spf 30+ sunscreen to sun-exposed areas.    HISTORY OF ACTINIC KERATOSIS Biopsy proven.  Clear today. Watch for recurrence.   ACTINIC DAMAGE - chronic, secondary to cumulative UV radiation exposure/sun exposure over time - diffuse scaly erythematous macules with underlying dyspigmentation - Recommend daily broad spectrum sunscreen SPF 30+ to sun-exposed areas, reapply every 2 hours as needed.  - Recommend staying in the shade or wearing long sleeves, sun glasses (UVA+UVB protection) and wide brim hats (4-inch brim around the entire circumference of the hat). - Call for new or changing lesions.  INFLAMED SEBORRHEIC KERATOSIS Exam: Erythematous keratotic or waxy stuck-on papule or plaque.  Symptomatic, irritating, patient would like treated.  Benign-appearing.  Call clinic for new or changing lesions.   Prior to  procedure, discussed risks of blister formation, small wound, skin dyspigmentation, or rare scar following treatment. Recommend Vaseline ointment to treated areas while healing.  Destruction Procedure Note Destruction method: cryotherapy   Informed consent: discussed and consent obtained   Lesion destroyed using liquid nitrogen: Yes   Outcome: patient tolerated procedure well with no complications   Post-procedure details: wound care instructions given   Locations: left cheek x1, right lower eyelid x2 # of Lesions Treated: 3  EPIDERMAL INCLUSION CYST Exam: Subcutaneous papule at left cheek  Benign-appearing. Exam most consistent with an epidermal inclusion cyst. Discussed that a cyst is a benign growth that can grow over time and sometimes get irritated or inflamed. Recommend observation if it is not bothersome. Discussed option of surgical excision to remove it if it is growing, symptomatic, or other changes noted. Please call for new or changing lesions so they can be evaluated.   Return for Follow Up As Scheduled, TBSE As Scheduled.  I, Lawson Radar, CMA, am acting as scribe for Darden Dates, MD.   Documentation: I have reviewed the above documentation for accuracy and completeness, and I agree with the above.  Darden Dates, MD

## 2022-09-08 ENCOUNTER — Ambulatory Visit: Payer: Medicare Other | Admitting: Dermatology

## 2022-10-18 DIAGNOSIS — M62830 Muscle spasm of back: Secondary | ICD-10-CM | POA: Diagnosis not present

## 2022-10-18 DIAGNOSIS — M542 Cervicalgia: Secondary | ICD-10-CM | POA: Diagnosis not present

## 2022-10-18 DIAGNOSIS — M9901 Segmental and somatic dysfunction of cervical region: Secondary | ICD-10-CM | POA: Diagnosis not present

## 2022-10-18 DIAGNOSIS — M9902 Segmental and somatic dysfunction of thoracic region: Secondary | ICD-10-CM | POA: Diagnosis not present

## 2022-10-27 DIAGNOSIS — M4802 Spinal stenosis, cervical region: Secondary | ICD-10-CM | POA: Diagnosis not present

## 2022-10-27 DIAGNOSIS — M5412 Radiculopathy, cervical region: Secondary | ICD-10-CM | POA: Diagnosis not present

## 2022-11-24 ENCOUNTER — Ambulatory Visit: Payer: Medicare Other | Admitting: Dermatology

## 2022-11-28 DIAGNOSIS — M4802 Spinal stenosis, cervical region: Secondary | ICD-10-CM | POA: Diagnosis not present

## 2022-11-28 DIAGNOSIS — M62838 Other muscle spasm: Secondary | ICD-10-CM | POA: Diagnosis not present

## 2022-11-28 DIAGNOSIS — M5412 Radiculopathy, cervical region: Secondary | ICD-10-CM | POA: Diagnosis not present

## 2022-12-19 ENCOUNTER — Other Ambulatory Visit: Payer: Medicare Other

## 2022-12-21 ENCOUNTER — Other Ambulatory Visit: Payer: Self-pay | Admitting: Nurse Practitioner

## 2022-12-21 DIAGNOSIS — Z1231 Encounter for screening mammogram for malignant neoplasm of breast: Secondary | ICD-10-CM

## 2022-12-22 ENCOUNTER — Other Ambulatory Visit: Payer: Medicare Other

## 2022-12-29 ENCOUNTER — Ambulatory Visit
Admission: RE | Admit: 2022-12-29 | Discharge: 2022-12-29 | Disposition: A | Discharge status: Home / Self Care | Payer: Medicare Other | Source: Ambulatory Visit | Attending: Otolaryngology | Admitting: Otolaryngology

## 2022-12-29 DIAGNOSIS — E042 Nontoxic multinodular goiter: Secondary | ICD-10-CM | POA: Diagnosis not present

## 2022-12-29 DIAGNOSIS — E041 Nontoxic single thyroid nodule: Secondary | ICD-10-CM

## 2023-01-08 NOTE — Patient Instructions (Signed)

## 2023-01-12 ENCOUNTER — Encounter: Payer: Self-pay | Admitting: Nurse Practitioner

## 2023-01-12 ENCOUNTER — Ambulatory Visit (INDEPENDENT_AMBULATORY_CARE_PROVIDER_SITE_OTHER): Payer: Medicare Other | Admitting: Nurse Practitioner

## 2023-01-12 DIAGNOSIS — M8580 Other specified disorders of bone density and structure, unspecified site: Secondary | ICD-10-CM

## 2023-01-12 DIAGNOSIS — I10 Essential (primary) hypertension: Secondary | ICD-10-CM

## 2023-01-12 DIAGNOSIS — E7800 Pure hypercholesterolemia, unspecified: Secondary | ICD-10-CM | POA: Diagnosis not present

## 2023-01-12 DIAGNOSIS — M542 Cervicalgia: Secondary | ICD-10-CM

## 2023-01-12 DIAGNOSIS — E041 Nontoxic single thyroid nodule: Secondary | ICD-10-CM

## 2023-01-12 DIAGNOSIS — Z780 Asymptomatic menopausal state: Secondary | ICD-10-CM

## 2023-01-12 DIAGNOSIS — Z803 Family history of malignant neoplasm of breast: Secondary | ICD-10-CM

## 2023-01-12 MED ORDER — LOSARTAN POTASSIUM 50 MG PO TABS: 50.0000 mg | ORAL_TABLET | Freq: Every day | ORAL | 4 refills | Status: DC

## 2023-01-12 NOTE — Assessment & Plan Note (Signed)
Noted on imaging 08/25/21, biopsy performed 11/04/21 with insufficient sample.  Continue collaboration with ENT, she is to have ultrasound every year for 5 years.  Thyroid labs up to date.

## 2023-01-12 NOTE — Assessment & Plan Note (Signed)
Chronic, ongoing.  BP continues to have occasional elevations. Will increase Losartan to 50 MG daily.  Educated her on this medication, she is familiar with it as her husband takes this.  Recommend he monitor BP at least a few mornings a week at home and document.  DASH diet at home.  Labs today: CMP.

## 2023-01-12 NOTE — Assessment & Plan Note (Signed)
Chronic, ongoing.  Does not wish to take statin, have discussed with her at length.  Continue supplements and monitor labs.  Lipid panel at physical.

## 2023-01-12 NOTE — Assessment & Plan Note (Signed)
Ongoing issue, has seen chiropractor with minimal benefit -- noted per patient report to have OA on imaging.  Continue visits with Dr. Yves Dill with physiatry, is offering benefit.

## 2023-01-12 NOTE — Progress Notes (Signed)
BP (!) 142/82 (BP Location: Left Arm, Patient Position: Sitting, Cuff Size: Normal)   Pulse 71   Ht 5\' 2"  (1.575 m)   Wt 164 lb 3.2 oz (74.5 kg)   LMP  (LMP Unknown)   SpO2 97%   BMI 30.03 kg/m    Subjective:    Patient ID: Laura Gibson, female    DOB: 1950-06-11, 72 y.o.   MRN: [id number]  HPI: Laura Gibson is a 72 y.o. female  Chief Complaint  Patient presents with   Hyperlipidemia   Hypertension    Patient says she would like to discuss her medication dose at today's visit. Patient says when she checks her BP. She notices some readings up and down. Patient within the last week she has seen 150/87, two days later 156/93 and then two more days 133/65 and she repeated the same day and it was 132/65.    Hypothyroidism   HYPERTENSION  Continues on Losartan 25 MG daily.  She is following with physiatry with last visit 11/28/22 for cervical radiculitis with injections + seeing chiropractor. Continues supplements for HLD.    Sees Dr. Willeen Cass for recent imaging noting enlargement in thyroid, they tried to perform biopsy on 11/04/21, not enough cells could be attained.  To have ultrasound every year for 5 years. Hypertension status: stable  Satisfied with current treatment? yes Duration of hypertension: chronic BP monitoring frequency:  every other day BP range: 130/60's range Previous BP meds: none Aspirin: no Recurrent headaches: no Visual changes: no Palpitations: no Dyspnea: no Chest pain: no Lower extremity edema: no Dizzy/lightheaded: no  The 10-year ASCVD risk score (Arnett DK, et al., 2019) is: 21.4%   Values used to calculate the score:     Age: 60 years     Sex: Female     Is Non-Hispanic African American: No     Diabetic: No     Tobacco smoker: No     Systolic Blood Pressure: 142 mmHg     Is BP treated: Yes     HDL Cholesterol: 63 mg/dL     Total Cholesterol: 254 mg/dL  OSTEOPENIA Taking her supplements. Satisfied with current treatment?:  yes Medication compliance: good compliance Adequate calcium & vitamin D: yes Weight bearing exercises: yes   Relevant past medical, surgical, family and social history reviewed and updated as indicated. Interim medical history since our last visit reviewed. Allergies and medications reviewed and updated.  Review of Systems  Constitutional:  Negative for activity change, appetite change, diaphoresis, fatigue and fever.  Respiratory:  Negative for cough, chest tightness and shortness of breath.   Cardiovascular:  Negative for chest pain, palpitations and leg swelling.  Neurological: Negative.   Psychiatric/Behavioral: Negative.     Per HPI unless specifically indicated above     Objective:    BP (!) 142/82 (BP Location: Left Arm, Patient Position: Sitting, Cuff Size: Normal)   Pulse 71   Ht 5\' 2"  (1.575 m)   Wt 164 lb 3.2 oz (74.5 kg)   LMP  (LMP Unknown)   SpO2 97%   BMI 30.03 kg/m   Wt Readings from Last 3 Encounters:  01/12/23 164 lb 3.2 oz (74.5 kg)  07/13/22 165 lb 8 oz (75.1 kg)  05/10/22 167 lb 6.4 oz (75.9 kg)    Physical Exam Vitals and nursing note reviewed.  Constitutional:      General: She is awake. She is not in acute distress.    Appearance: She is well-developed and well-groomed.  She is not ill-appearing or toxic-appearing.  HENT:     Head: Normocephalic.     Right Ear: Hearing normal.     Left Ear: Hearing normal.  Eyes:     General: Lids are normal.        Right eye: No discharge.        Left eye: No discharge.     Conjunctiva/sclera: Conjunctivae normal.     Pupils: Pupils are equal, round, and reactive to light.  Neck:     Thyroid: No thyromegaly.     Vascular: No carotid bruit.  Cardiovascular:     Rate and Rhythm: Normal rate and regular rhythm.     Heart sounds: Normal heart sounds. No murmur heard.    No gallop.  Pulmonary:     Effort: Pulmonary effort is normal. No accessory muscle usage or respiratory distress.     Breath sounds: Normal  breath sounds.  Abdominal:     General: Bowel sounds are normal.     Palpations: Abdomen is soft. There is no hepatomegaly or splenomegaly.  Musculoskeletal:     Cervical back: Normal range of motion and neck supple.     Right lower leg: No edema.     Left lower leg: No edema.  Lymphadenopathy:     Cervical: No cervical adenopathy.  Skin:    General: Skin is warm and dry.  Neurological:     Mental Status: She is alert and oriented to person, place, and time.  Psychiatric:        Attention and Perception: Attention normal.        Mood and Affect: Mood normal.        Speech: Speech normal.        Behavior: Behavior normal. Behavior is cooperative.        Thought Content: Thought content normal.    Results for orders placed or performed in visit on 07/14/22  VITAMIN D 25 Hydroxy (Vit-D Deficiency, Fractures)  Result Value Ref Range   Vit D, 25-Hydroxy 39.9 30.0 - 100.0 ng/mL  TSH  Result Value Ref Range   TSH 2.400 0.450 - 4.500 uIU/mL  T4, free  Result Value Ref Range   Free T4 1.46 0.82 - 1.77 ng/dL  Microalbumin, Urine Waived  Result Value Ref Range   Microalb, Ur Waived 10 0 - 19 mg/L   Creatinine, Urine Waived 10 10 - 300 mg/dL   Microalb/Creat Ratio 30-300 (H) <30 mg/g  Lipid Panel w/o Chol/HDL Ratio  Result Value Ref Range   Cholesterol, Total 254 (H) 100 - 199 mg/dL   Triglycerides 829 (H) 0 - 149 mg/dL   HDL 63 >56 mg/dL   VLDL Cholesterol Cal 30 5 - 40 mg/dL   LDL Chol Calc (NIH) 213 (H) 0 - 99 mg/dL  Comprehensive metabolic panel  Result Value Ref Range   Glucose 106 (H) 70 - 99 mg/dL   BUN 15 8 - 27 mg/dL   Creatinine, Ser 0.86 0.57 - 1.00 mg/dL   eGFR 79 >57 QI/ONG/2.95   BUN/Creatinine Ratio 19 12 - 28   Sodium 140 134 - 144 mmol/L   Potassium 4.5 3.5 - 5.2 mmol/L   Chloride 103 96 - 106 mmol/L   CO2 25 20 - 29 mmol/L   Calcium 9.7 8.7 - 10.3 mg/dL   Total Protein 7.0 6.0 - 8.5 g/dL   Albumin 4.4 3.8 - 4.8 g/dL   Globulin, Total 2.6 1.5 - 4.5 g/dL    Albumin/Globulin Ratio 1.7  1.2 - 2.2   Bilirubin Total 0.3 0.0 - 1.2 mg/dL   Alkaline Phosphatase 82 44 - 121 IU/L   AST 17 0 - 40 IU/L   ALT 17 0 - 32 IU/L  CBC with Differential/Platelet  Result Value Ref Range   WBC 5.1 3.4 - 10.8 x10E3/uL   RBC 5.01 3.77 - 5.28 x10E6/uL   Hemoglobin 15.1 11.1 - 15.9 g/dL   Hematocrit 04.5 40.9 - 46.6 %   MCV 91 79 - 97 fL   MCH 30.1 26.6 - 33.0 pg   MCHC 33.0 31.5 - 35.7 g/dL   RDW 81.1 91.4 - 78.2 %   Platelets 263 150 - 450 x10E3/uL   Neutrophils 54 Not Estab. %   Lymphs 31 Not Estab. %   Monocytes 8 Not Estab. %   Eos 6 Not Estab. %   Basos 1 Not Estab. %   Neutrophils Absolute 2.7 1.4 - 7.0 x10E3/uL   Lymphocytes Absolute 1.6 0.7 - 3.1 x10E3/uL   Monocytes Absolute 0.4 0.1 - 0.9 x10E3/uL   EOS (ABSOLUTE) 0.3 0.0 - 0.4 x10E3/uL   Basophils Absolute 0.1 0.0 - 0.2 x10E3/uL   Immature Granulocytes 0 Not Estab. %   Immature Grans (Abs) 0.0 0.0 - 0.1 x10E3/uL      Assessment & Plan:   Problem List Items Addressed This Visit       Cardiovascular and Mediastinum   Essential hypertension    Chronic, ongoing.  BP continues to have occasional elevations. Will increase Losartan to 50 MG daily.  Educated her on this medication, she is familiar with it as her husband takes this.  Recommend he monitor BP at least a few mornings a week at home and document.  DASH diet at home.  Labs today: CMP.        Relevant Medications   losartan (COZAAR) 50 MG tablet     Endocrine   Thyroid nodule    Noted on imaging 08/25/21, biopsy performed 11/04/21 with insufficient sample.  Continue collaboration with ENT, she is to have ultrasound every year for 5 years.  Thyroid labs up to date.        Musculoskeletal and Integument   Osteopenia after menopause    Chronic.  Continue supplements, check Vit D level today.  Next DEXA due in 2025.        Other   Elevated LDL cholesterol level    Chronic, ongoing.  Does not wish to take statin, have discussed  with her at length.  Continue supplements and monitor labs.  Lipid panel at physical.      Relevant Orders   Comprehensive metabolic panel   Neck pain    Ongoing issue, has seen chiropractor with minimal benefit -- noted per patient report to have OA on imaging.  Continue visits with Dr. Yves Dill with physiatry, is offering benefit.        Follow up plan: Return in about 6 weeks (around 02/23/2023) for HTN -- increased Losartan & annual physical after 07/14/23.

## 2023-01-12 NOTE — Assessment & Plan Note (Signed)
Chronic.  Continue supplements, check Vit D level today.  Next DEXA due in 2025.

## 2023-01-13 LAB — COMPREHENSIVE METABOLIC PANEL
ALT: 20 IU/L (ref 0–32)
AST: 14 IU/L (ref 0–40)
Albumin: 4.4 g/dL (ref 3.8–4.8)
Alkaline Phosphatase: 85 IU/L (ref 44–121)
BUN/Creatinine Ratio: 21 (ref 12–28)
BUN: 15 mg/dL (ref 8–27)
Bilirubin Total: 0.2 mg/dL (ref 0.0–1.2)
CO2: 25 mmol/L (ref 20–29)
Calcium: 9.9 mg/dL (ref 8.7–10.3)
Chloride: 102 mmol/L (ref 96–106)
Creatinine, Ser: 0.72 mg/dL (ref 0.57–1.00)
Globulin, Total: 2.4 g/dL (ref 1.5–4.5)
Glucose: 98 mg/dL (ref 70–99)
Potassium: 4.5 mmol/L (ref 3.5–5.2)
Sodium: 141 mmol/L (ref 134–144)
Total Protein: 6.8 g/dL (ref 6.0–8.5)
eGFR: 88 mL/min/{1.73_m2} (ref >59)

## 2023-02-06 ENCOUNTER — Ambulatory Visit
Admission: RE | Admit: 2023-02-06 | Discharge: 2023-02-06 | Disposition: A | Discharge status: Home / Self Care | Payer: Medicare Other | Source: Ambulatory Visit | Attending: Nurse Practitioner | Admitting: Nurse Practitioner

## 2023-02-06 DIAGNOSIS — Z1231 Encounter for screening mammogram for malignant neoplasm of breast: Secondary | ICD-10-CM | POA: Insufficient documentation

## 2023-02-07 NOTE — Progress Notes (Signed)
Contacted via MyChart   Normal mammogram, may repeat in one year:)

## 2023-02-24 DIAGNOSIS — M47812 Spondylosis without myelopathy or radiculopathy, cervical region: Secondary | ICD-10-CM | POA: Diagnosis not present

## 2023-02-26 NOTE — Patient Instructions (Signed)

## 2023-03-02 ENCOUNTER — Encounter: Payer: Self-pay | Admitting: Nurse Practitioner

## 2023-03-02 ENCOUNTER — Ambulatory Visit (INDEPENDENT_AMBULATORY_CARE_PROVIDER_SITE_OTHER): Payer: Medicare Other | Admitting: Nurse Practitioner

## 2023-03-02 VITALS — BP 142/78 | HR 64 | Temp 97.9°F | Ht 62.5 in | Wt 160.4 lb

## 2023-03-02 DIAGNOSIS — I10 Essential (primary) hypertension: Secondary | ICD-10-CM

## 2023-03-02 DIAGNOSIS — Z23 Encounter for immunization: Secondary | ICD-10-CM

## 2023-03-02 MED ORDER — LOSARTAN POTASSIUM 100 MG PO TABS: 100.0000 mg | ORAL_TABLET | Freq: Every day | ORAL | 4 refills | Status: DC

## 2023-03-02 NOTE — Progress Notes (Signed)
BP (!) 142/78 (BP Location: Left Arm, Patient Position: Sitting, Cuff Size: Normal)   Pulse 64   Temp 97.9 F (36.6 C) (Oral)   Ht 5' 2.5" (1.588 m)   Wt 160 lb 6.4 oz (72.8 kg)   LMP  (LMP Unknown)   SpO2 97%   BMI 28.87 kg/m    Subjective:    Patient ID: Laura Gibson, female    DOB: 21-May-1950, 72 y.o.   MRN: [id number]  HPI: Laura Gibson is a 72 y.o. female  Chief Complaint  Patient presents with   Hypertension    6 week f/up   158/72 on 10/14  149/77 on 10/16  159/73 on 10/17   HYPERTENSION without Chronic Kidney Disease Follow-up to check on BP today.  We increased her Losartan on 01/12/23 to 50 MG daily.  She prefers not to take a lot of medications, uses more supplements at baseline.  Finds her elevations are related to pain in neck, she follows with physiatry.  Hypertension status: uncontrolled  Satisfied with current treatment? yes Duration of hypertension: chronic BP monitoring frequency:  every other day BP range: as above numbers BP medication side effects:  no Medication compliance: good compliance Aspirin: no Recurrent headaches: no Visual changes: no Palpitations: no Dyspnea: no Chest pain: no Lower extremity edema: no Dizzy/lightheaded: no  The 10-year ASCVD risk score (Arnett DK, et al., 2019) is: 23.5%   Values used to calculate the score:     Age: 57 years     Sex: Female     Is Non-Hispanic African American: No     Diabetic: No     Tobacco smoker: No     Systolic Blood Pressure: 142 mmHg     Is BP treated: Yes     HDL Cholesterol: 63 mg/dL     Total Cholesterol: 254 mg/dL  Relevant past medical, surgical, family and social history reviewed and updated as indicated. Interim medical history since our last visit reviewed. Allergies and medications reviewed and updated.  Review of Systems  Constitutional:  Negative for activity change, appetite change, diaphoresis, fatigue and fever.  Respiratory:  Negative for cough, chest tightness  and shortness of breath.   Cardiovascular:  Negative for chest pain, palpitations and leg swelling.  Neurological: Negative.   Psychiatric/Behavioral: Negative.      Per HPI unless specifically indicated above     Objective:    BP (!) 142/78 (BP Location: Left Arm, Patient Position: Sitting, Cuff Size: Normal)   Pulse 64   Temp 97.9 F (36.6 C) (Oral)   Ht 5' 2.5" (1.588 m)   Wt 160 lb 6.4 oz (72.8 kg)   LMP  (LMP Unknown)   SpO2 97%   BMI 28.87 kg/m   Wt Readings from Last 3 Encounters:  03/02/23 160 lb 6.4 oz (72.8 kg)  01/12/23 164 lb 3.2 oz (74.5 kg)  07/13/22 165 lb 8 oz (75.1 kg)    Physical Exam Vitals and nursing note reviewed.  Constitutional:      General: She is awake. She is not in acute distress.    Appearance: She is well-developed and well-groomed. She is not ill-appearing or toxic-appearing.  HENT:     Head: Normocephalic.     Right Ear: Hearing normal.     Left Ear: Hearing normal.  Eyes:     General: Lids are normal.        Right eye: No discharge.        Left eye: No  discharge.     Conjunctiva/sclera: Conjunctivae normal.     Pupils: Pupils are equal, round, and reactive to light.  Neck:     Thyroid: No thyromegaly.     Vascular: No carotid bruit.  Cardiovascular:     Rate and Rhythm: Normal rate and regular rhythm.     Heart sounds: Normal heart sounds. No murmur heard.    No gallop.  Pulmonary:     Effort: Pulmonary effort is normal. No accessory muscle usage or respiratory distress.     Breath sounds: Normal breath sounds.  Abdominal:     General: Bowel sounds are normal.     Palpations: Abdomen is soft. There is no hepatomegaly or splenomegaly.  Musculoskeletal:     Cervical back: Normal range of motion and neck supple.     Right lower leg: No edema.     Left lower leg: No edema.  Lymphadenopathy:     Cervical: No cervical adenopathy.  Skin:    General: Skin is warm and dry.  Neurological:     Mental Status: She is alert and  oriented to person, place, and time.  Psychiatric:        Attention and Perception: Attention normal.        Mood and Affect: Mood normal.        Speech: Speech normal.        Behavior: Behavior normal. Behavior is cooperative.        Thought Content: Thought content normal.     Results for orders placed or performed in visit on 01/12/23  Comprehensive metabolic panel  Result Value Ref Range   Glucose 98 70 - 99 mg/dL   BUN 15 8 - 27 mg/dL   Creatinine, Ser 2.95 0.57 - 1.00 mg/dL   eGFR 88 >28 UX/LKG/4.01   BUN/Creatinine Ratio 21 12 - 28   Sodium 141 134 - 144 mmol/L   Potassium 4.5 3.5 - 5.2 mmol/L   Chloride 102 96 - 106 mmol/L   CO2 25 20 - 29 mmol/L   Calcium 9.9 8.7 - 10.3 mg/dL   Total Protein 6.8 6.0 - 8.5 g/dL   Albumin 4.4 3.8 - 4.8 g/dL   Globulin, Total 2.4 1.5 - 4.5 g/dL   Bilirubin Total 0.2 0.0 - 1.2 mg/dL   Alkaline Phosphatase 85 44 - 121 IU/L   AST 14 0 - 40 IU/L   ALT 20 0 - 32 IU/L      Assessment & Plan:   Problem List Items Addressed This Visit       Cardiovascular and Mediastinum   Essential hypertension - Primary    Chronic, ongoing.  BP continues to have occasional elevations in office and at home, more SBP. Will increase Losartan to 100 MG daily.  Educated her on this medication, she is familiar with it as her husband takes this.  Recommend she monitor BP at least a few mornings a week at home and document.  DASH diet at home.  Labs today: up to date.   Discussed with her if SBP remains elevated next visit above goal, could consider changing to combo pill like Olmesartan-Amlodipine, to maintain ARB, but add CCB as ?some ISH present.      Relevant Medications   losartan (COZAAR) 100 MG tablet     Follow up plan: Return in about 4 weeks (around 03/30/2023) for BP CHECK -- increased Losartan to 100MG .

## 2023-03-02 NOTE — Assessment & Plan Note (Signed)
Chronic, ongoing.  BP continues to have occasional elevations in office and at home, more SBP. Will increase Losartan to 100 MG daily.  Educated her on this medication, she is familiar with it as her husband takes this.  Recommend she monitor BP at least a few mornings a week at home and document.  DASH diet at home.  Labs today: up to date.   Discussed with her if SBP remains elevated next visit above goal, could consider changing to combo pill like Olmesartan-Amlodipine, to maintain ARB, but add CCB as ?some ISH present.

## 2023-03-20 ENCOUNTER — Encounter: Payer: Self-pay | Admitting: Dermatology

## 2023-03-20 ENCOUNTER — Ambulatory Visit: Payer: Medicare Other | Admitting: Dermatology

## 2023-03-20 DIAGNOSIS — L814 Other melanin hyperpigmentation: Secondary | ICD-10-CM | POA: Diagnosis not present

## 2023-03-20 DIAGNOSIS — D1801 Hemangioma of skin and subcutaneous tissue: Secondary | ICD-10-CM

## 2023-03-20 DIAGNOSIS — D2271 Melanocytic nevi of right lower limb, including hip: Secondary | ICD-10-CM

## 2023-03-20 DIAGNOSIS — L821 Other seborrheic keratosis: Secondary | ICD-10-CM | POA: Diagnosis not present

## 2023-03-20 DIAGNOSIS — L578 Other skin changes due to chronic exposure to nonionizing radiation: Secondary | ICD-10-CM | POA: Diagnosis not present

## 2023-03-20 DIAGNOSIS — W908XXA Exposure to other nonionizing radiation, initial encounter: Secondary | ICD-10-CM

## 2023-03-20 DIAGNOSIS — Z1283 Encounter for screening for malignant neoplasm of skin: Secondary | ICD-10-CM

## 2023-03-20 DIAGNOSIS — L820 Inflamed seborrheic keratosis: Secondary | ICD-10-CM

## 2023-03-20 DIAGNOSIS — D229 Melanocytic nevi, unspecified: Secondary | ICD-10-CM

## 2023-03-20 DIAGNOSIS — D225 Melanocytic nevi of trunk: Secondary | ICD-10-CM | POA: Diagnosis not present

## 2023-03-20 DIAGNOSIS — I781 Nevus, non-neoplastic: Secondary | ICD-10-CM

## 2023-03-20 NOTE — Progress Notes (Signed)
Follow-Up Visit   Subjective  Laura Gibson is a 72 y.o. female who presents for the following: Skin Cancer Screening and Full Body Skin Exam  The patient presents for Total-Body Skin Exam (TBSE) for skin cancer screening and mole check. The patient has spots, moles and lesions to be evaluated, some may be new or changing and the patient may have concern these could be cancer.   The following portions of the chart were reviewed this encounter and updated as appropriate: medications, allergies, medical history  Review of Systems:  No other skin or systemic complaints except as noted in HPI or Assessment and Plan.  Objective  Well appearing patient in no apparent distress; mood and affect are within normal limits.  A full examination was performed including scalp, head, eyes, ears, nose, lips, neck, chest, axillae, abdomen, back, buttocks, bilateral upper extremities, bilateral lower extremities, hands, feet, fingers, toes, fingernails, and toenails. All findings within normal limits unless otherwise noted below.   Relevant physical exam findings are noted in the Assessment and Plan.  L cheek x 1, R neck x 1, mid back x 3, lower back x 1 (6) Erythematous stuck-on, waxy papule or plaque    Assessment & Plan   SKIN CANCER SCREENING PERFORMED TODAY.  ACTINIC DAMAGE - Chronic condition, secondary to cumulative UV/sun exposure - diffuse scaly erythematous macules with underlying dyspigmentation - Recommend daily broad spectrum sunscreen SPF 30+ to sun-exposed areas, reapply every 2 hours as needed.  - Staying in the shade or wearing long sleeves, sun glasses (UVA+UVB protection) and wide brim hats (4-inch brim around the entire circumference of the hat) are also recommended for sun protection.  - Call for new or changing lesions.  LENTIGINES, SEBORRHEIC KERATOSES, HEMANGIOMAS - Benign normal skin lesions - Benign-appearing - Call for any changes   SEBORRHEIC KERATOSIS -  Stuck-on, waxy, tan-brown papules and/or plaques R med upper ankle x 2.  - Benign-appearing - Discussed benign etiology and prognosis. - Observe - Call for any changes - pt to call for fade cream rx, discussed cosmetic cryotherapy but not recommended due to risk poor healing at ankle, pt defers due to cost.  MELANOCYTIC NEVI Exam: Tan-brown and/or pink-flesh-colored symmetric macules and papules - L mid abdomen 0.6 x 0.4 cm med brown thin papules with light center. - R post thigh 0.3 cm speckled brown macule.  - L gluteal cleft 0.4 cm brown macule.  Treatment Plan: Benign appearing on exam today. Recommend observation. Call clinic for new or changing moles. Recommend daily use of broad spectrum spf 30+ sunscreen to sun-exposed areas.   TELANGIECTASIA Exam: L malar cheek 0.7 x 0.5 cm blanching, pink/red macule.  Treatment Plan: Benign appearing on exam Call for changes   Inflamed seborrheic keratosis (6) L cheek x 1, R neck x 1, mid back x 3, lower back x 1  Symptomatic, irritating, patient would like treated.   Residual ISK on the L cheek.    Destruction of lesion - L cheek x 1, R neck x 1, mid back x 3, lower back x 1 (6)  Destruction method: cryotherapy   Informed consent: discussed and consent obtained   Lesion destroyed using liquid nitrogen: Yes   Region frozen until ice ball extended beyond lesion: Yes   Outcome: patient tolerated procedure well with no complications   Post-procedure details: wound care instructions given   Additional details:  Prior to procedure, discussed risks of blister formation, small wound, skin dyspigmentation, or rare scar following  cryotherapy. Recommend Vaseline ointment to treated areas while healing.   Skin cancer screening  Actinic skin damage  Lentigo  Seborrheic keratosis  Hemangioma of skin  Nevus  Telangiectasia    Return in about 1 year (around 03/19/2024) for TBSE.  Maylene Roes, CMA, am acting as scribe for Willeen Niece, MD .   Documentation: I have reviewed the above documentation for accuracy and completeness, and I agree with the above.  Willeen Niece, MD

## 2023-03-20 NOTE — Patient Instructions (Addendum)

## 2023-04-02 NOTE — Patient Instructions (Signed)
Be Involved in Caring For Your Health:  Taking Medications When medications are taken as directed, they can greatly improve your health. But if they are not taken as prescribed, they may not work. In some cases, not taking them correctly can be harmful. To help ensure your treatment remains effective and safe, understand your medications and how to take them. Bring your medications to each visit for review by your provider.  Your lab results, notes, and after visit summary will be available on My Chart. We strongly encourage you to use this feature. If lab results are abnormal the clinic will contact you with the appropriate steps. If the clinic does not contact you assume the results are satisfactory. You can always view your results on My Chart. If you have questions regarding your health or results, please contact the clinic during office hours. You can also ask questions on My Chart.  We at [facility] are grateful that you chose [place] to provide your care. We strive to provide evidence-based and compassionate care and are always looking for feedback. If you get a survey from the clinic please complete this so we can hear your opinions.  Heart-Healthy Eating Plan Many factors influence your heart health, including eating and exercise habits. Heart health is also called coronary health. Coronary risk increases with abnormal blood fat (lipid) levels. A heart-healthy eating plan includes limiting unhealthy fats, increasing healthy fats, limiting salt (sodium) intake, and making other diet and lifestyle changes. What is my plan? Your health care provider may recommend that: You limit your fat intake to _________% or less of your total calories each day. You limit your saturated fat intake to _________% or less of your total calories each day. You limit the amount of cholesterol in your diet to less than _________ mg per day. You limit the amount of sodium in your diet to less than _________  mg per day. What are tips for following this plan? Cooking Cook foods using methods other than frying. Baking, boiling, grilling, and broiling are all good options. Other ways to reduce fat include: Removing the skin from poultry. Removing all visible fats from meats. Steaming vegetables in water or broth. Meal planning  At meals, imagine dividing your plate into fourths: Fill one-half of your plate with vegetables and green salads. Fill one-fourth of your plate with whole grains. Fill one-fourth of your plate with lean protein foods. Eat 2-4 cups of vegetables per day. One cup of vegetables equals 1 cup (91 g) broccoli or cauliflower florets, 2 medium carrots, 1 large bell pepper, 1 large sweet potato, 1 large tomato, 1 medium white potato, 2 cups (150 g) raw leafy greens. Eat 1-2 cups of fruit per day. One cup of fruit equals 1 small apple, 1 large banana, 1 cup (237 g) mixed fruit, 1 large orange,  cup (82 g) dried fruit, 1 cup (240 mL) 100% fruit juice. Eat more foods that contain soluble fiber. Examples include apples, broccoli, carrots, beans, peas, and barley. Aim to get 25-30 g of fiber per day. Increase your consumption of legumes, nuts, and seeds to 4-5 servings per week. One serving of dried beans or legumes equals  cup (90 g) cooked, 1 serving of nuts is  oz (12 almonds, 24 pistachios, or 7 walnut halves), and 1 serving of seeds equals  oz (8 g). Fats Choose healthy fats more often. Choose monounsaturated and polyunsaturated fats, such as olive and canola oils, avocado oil, flaxseeds, walnuts, almonds, and seeds. Eat  more omega-3 fats. Choose salmon, mackerel, sardines, tuna, flaxseed oil, and ground flaxseeds. Aim to eat fish at least 2 times each week. Check food labels carefully to identify foods with trans fats or high amounts of saturated fat. Limit saturated fats. These are found in animal products, such as meats, butter, and cream. Plant sources of saturated fats  include palm oil, palm kernel oil, and coconut oil. Avoid foods with partially hydrogenated oils in them. These contain trans fats. Examples are stick margarine, some tub margarines, cookies, crackers, and other baked goods. Avoid fried foods. General information Eat more home-cooked food and less restaurant, buffet, and fast food. Limit or avoid alcohol. Limit foods that are high in added sugar and simple starches such as foods made using white refined flour (white breads, pastries, sweets). Lose weight if you are overweight. Losing just 5-10% of your body weight can help your overall health and prevent diseases such as diabetes and heart disease. Monitor your sodium intake, especially if you have high blood pressure. Talk with your health care provider about your sodium intake. Try to incorporate more vegetarian meals weekly. What foods should I eat? Fruits All fresh, canned (in natural juice), or frozen fruits. Vegetables Fresh or frozen vegetables (raw, steamed, roasted, or grilled). Green salads. Grains Most grains. Choose whole wheat and whole grains most of the time. Rice and pasta, including brown rice and pastas made with whole wheat. Meats and other proteins Lean, well-trimmed beef, veal, pork, and lamb. Chicken and [place] without skin. All fish and shellfish. Wild duck, rabbit, pheasant, and venison. Egg whites or low-cholesterol egg substitutes. Dried beans, peas, lentils, and tofu. Seeds and most nuts. Dairy Low-fat or nonfat cheeses, including ricotta and mozzarella. Skim or 1% milk (liquid, powdered, or evaporated). Buttermilk made with low-fat milk. Nonfat or low-fat yogurt. Fats and oils Non-hydrogenated (trans-free) margarines. Vegetable oils, including soybean, sesame, sunflower, olive, avocado, peanut, safflower, corn, canola, and cottonseed. Salad dressings or mayonnaise made with a vegetable oil. Beverages Water (mineral or sparkling). Coffee and tea. Unsweetened ice  tea. Diet beverages. Sweets and desserts Sherbet, gelatin, and fruit ice. Small amounts of dark chocolate. Limit all sweets and desserts. Seasonings and condiments All seasonings and condiments. The items listed above may not be a complete list of foods and beverages you can eat. Contact a dietitian for more options. What foods should I avoid? Fruits Canned fruit in heavy syrup. Fruit in cream or butter sauce. Fried fruit. Limit coconut. Vegetables Vegetables cooked in cheese, cream, or butter sauce. Fried vegetables. Grains Breads made with saturated or trans fats, oils, or whole milk. Croissants. Sweet rolls. Donuts. High-fat crackers, such as cheese crackers and chips. Meats and other proteins Fatty meats, such as hot dogs, ribs, sausage, bacon, rib-eye roast or steak. High-fat deli meats, such as salami and bologna. Caviar. Domestic duck and goose. Organ meats, such as liver. Dairy Cream, sour cream, cream cheese, and creamed cottage cheese. Whole-milk cheeses. Whole or 2% milk (liquid, evaporated, or condensed). Whole buttermilk. Cream sauce or high-fat cheese sauce. Whole-milk yogurt. Fats and oils Meat fat, or shortening. Cocoa butter, hydrogenated oils, palm oil, coconut oil, palm kernel oil. Solid fats and shortenings, including bacon fat, salt pork, lard, and butter. Nondairy cream substitutes. Salad dressings with cheese or sour cream. Beverages Regular sodas and any drinks with added sugar. Sweets and desserts Frosting. Pudding. Cookies. Cakes. Pies. Milk chocolate or white chocolate. Buttered syrups. Full-fat ice cream or ice cream drinks. The items listed above may  not be a complete list of foods and beverages to avoid. Contact a dietitian for more information. Summary Heart-healthy meal planning includes limiting unhealthy fats, increasing healthy fats, limiting salt (sodium) intake and making other diet and lifestyle changes. Lose weight if you are overweight. Losing just  5-10% of your body weight can help your overall health and prevent diseases such as diabetes and heart disease. Focus on eating a balance of foods, including fruits and vegetables, low-fat or nonfat dairy, lean protein, nuts and legumes, whole grains, and heart-healthy oils and fats. This information is not intended to replace advice given to you by your health care provider. Make sure you discuss any questions you have with your health care provider. Document Revised: 06/07/2021 Document Reviewed: 06/07/2021 Elsevier Patient Education  2024 [employer].

## 2023-04-06 ENCOUNTER — Ambulatory Visit (INDEPENDENT_AMBULATORY_CARE_PROVIDER_SITE_OTHER): Payer: Medicare Other | Admitting: Nurse Practitioner

## 2023-04-06 ENCOUNTER — Encounter: Payer: Self-pay | Admitting: Nurse Practitioner

## 2023-04-06 VITALS — BP 140/68 | HR 64 | Temp 98.3°F | Ht 62.5 in | Wt 158.4 lb

## 2023-04-06 DIAGNOSIS — I10 Essential (primary) hypertension: Secondary | ICD-10-CM | POA: Diagnosis not present

## 2023-04-06 MED ORDER — AMLODIPINE BESYLATE 2.5 MG PO TABS: 2.5000 mg | ORAL_TABLET | Freq: Every day | ORAL | 4 refills | Status: DC

## 2023-04-06 NOTE — Progress Notes (Signed)
BP (!) 140/68 (BP Location: Left Arm, Patient Position: Sitting)   Pulse 64   Temp 98.3 F (36.8 C) (Oral)   Ht 5' 2.5" (1.588 m)   Wt 158 lb 6.4 oz (71.8 kg)   LMP  (LMP Unknown)   SpO2 98%   BMI 28.51 kg/m    Subjective:    Patient ID: Laura Gibson, female    DOB: 1951/04/27, 72 y.o.   MRN: [id number]  HPI: Laura Gibson is a 72 y.o. female  Chief Complaint  Patient presents with   Hypertension    4 week f/up   HYPERTENSION without Chronic Kidney Disease Follow-up for BP check today, increased Losartan to 100 MG on 03/02/23. Is tolerating this. Hypertension status: stable  Satisfied with current treatment? yes Duration of hypertension: chronic BP monitoring frequency:  a few times a week BP range: 139/72, 132/63, 143/74 (this was in evening) BP medication side effects:  no Medication compliance: good compliance Aspirin: no Recurrent headaches: no Visual changes: no Palpitations: no Dyspnea: no Chest pain: no Lower extremity edema: no Dizzy/lightheaded: no   Relevant past medical, surgical, family and social history reviewed and updated as indicated. Interim medical history since our last visit reviewed. Allergies and medications reviewed and updated.  Review of Systems  Constitutional:  Negative for activity change, appetite change, diaphoresis, fatigue and fever.  Respiratory:  Negative for cough, chest tightness and shortness of breath.   Cardiovascular:  Negative for chest pain, palpitations and leg swelling.  Neurological: Negative.   Psychiatric/Behavioral: Negative.      Per HPI unless specifically indicated above     Objective:    BP (!) 140/68 (BP Location: Left Arm, Patient Position: Sitting)   Pulse 64   Temp 98.3 F (36.8 C) (Oral)   Ht 5' 2.5" (1.588 m)   Wt 158 lb 6.4 oz (71.8 kg)   LMP  (LMP Unknown)   SpO2 98%   BMI 28.51 kg/m   Wt Readings from Last 3 Encounters:  04/06/23 158 lb 6.4 oz (71.8 kg)  03/02/23 160 lb 6.4 oz  (72.8 kg)  01/12/23 164 lb 3.2 oz (74.5 kg)    Physical Exam Vitals and nursing note reviewed.  Constitutional:      General: She is awake. She is not in acute distress.    Appearance: She is well-developed and well-groomed. She is not ill-appearing or toxic-appearing.  HENT:     Head: Normocephalic.     Right Ear: Hearing normal.     Left Ear: Hearing normal.  Eyes:     General: Lids are normal.        Right eye: No discharge.        Left eye: No discharge.     Conjunctiva/sclera: Conjunctivae normal.     Pupils: Pupils are equal, round, and reactive to light.  Neck:     Thyroid: No thyromegaly.     Vascular: No carotid bruit.  Cardiovascular:     Rate and Rhythm: Normal rate and regular rhythm.     Heart sounds: Normal heart sounds. No murmur heard.    No gallop.  Pulmonary:     Effort: Pulmonary effort is normal. No accessory muscle usage or respiratory distress.     Breath sounds: Normal breath sounds.  Abdominal:     General: Bowel sounds are normal.     Palpations: Abdomen is soft. There is no hepatomegaly or splenomegaly.  Musculoskeletal:     Cervical back: Normal range of motion  and neck supple.     Right lower leg: No edema.     Left lower leg: No edema.  Lymphadenopathy:     Cervical: No cervical adenopathy.  Skin:    General: Skin is warm and dry.  Neurological:     Mental Status: She is alert and oriented to person, place, and time.  Psychiatric:        Attention and Perception: Attention normal.        Mood and Affect: Mood normal.        Speech: Speech normal.        Behavior: Behavior normal. Behavior is cooperative.        Thought Content: Thought content normal.     Results for orders placed or performed in visit on 01/12/23  Comprehensive metabolic panel  Result Value Ref Range   Glucose 98 70 - 99 mg/dL   BUN 15 8 - 27 mg/dL   Creatinine, Ser 4.09 0.57 - 1.00 mg/dL   eGFR 88 >81 XB/JYN/8.29   BUN/Creatinine Ratio 21 12 - 28   Sodium 141  134 - 144 mmol/L   Potassium 4.5 3.5 - 5.2 mmol/L   Chloride 102 96 - 106 mmol/L   CO2 25 20 - 29 mmol/L   Calcium 9.9 8.7 - 10.3 mg/dL   Total Protein 6.8 6.0 - 8.5 g/dL   Albumin 4.4 3.8 - 4.8 g/dL   Globulin, Total 2.4 1.5 - 4.5 g/dL   Bilirubin Total 0.2 0.0 - 1.2 mg/dL   Alkaline Phosphatase 85 44 - 121 IU/L   AST 14 0 - 40 IU/L   ALT 20 0 - 32 IU/L      Assessment & Plan:   Problem List Items Addressed This Visit       Cardiovascular and Mediastinum   Essential hypertension - Primary    Chronic, ongoing.  BP continues to have occasional elevations in office and at home, more SBP. Will continue Losartan 100 MG daily and add on Amlodipine 2.5 MG daily with this.  Educated her on this medication.  Recommend she monitor BP at least a few mornings a week at home and document.  DASH diet at home.  Labs today: up to date.  ?some ISH present.      Relevant Medications   amLODipine (NORVASC) 2.5 MG tablet     Follow up plan: Return in about 6 weeks (around 05/18/2023) for HTN.

## 2023-04-06 NOTE — Assessment & Plan Note (Signed)
Chronic, ongoing.  BP continues to have occasional elevations in office and at home, more SBP. Will continue Losartan 100 MG daily and add on Amlodipine 2.5 MG daily with this.  Educated her on this medication.  Recommend she monitor BP at least a few mornings a week at home and document.  DASH diet at home.  Labs today: up to date.  ?some ISH present.

## 2023-04-10 DIAGNOSIS — M5412 Radiculopathy, cervical region: Secondary | ICD-10-CM | POA: Diagnosis not present

## 2023-04-10 DIAGNOSIS — M4802 Spinal stenosis, cervical region: Secondary | ICD-10-CM | POA: Diagnosis not present

## 2023-04-10 DIAGNOSIS — M62838 Other muscle spasm: Secondary | ICD-10-CM | POA: Diagnosis not present

## 2023-04-10 DIAGNOSIS — M47812 Spondylosis without myelopathy or radiculopathy, cervical region: Secondary | ICD-10-CM | POA: Diagnosis not present

## 2023-04-18 DIAGNOSIS — I10 Essential (primary) hypertension: Secondary | ICD-10-CM | POA: Diagnosis not present

## 2023-04-18 DIAGNOSIS — H2513 Age-related nuclear cataract, bilateral: Secondary | ICD-10-CM | POA: Diagnosis not present

## 2023-04-18 DIAGNOSIS — H353121 Nonexudative age-related macular degeneration, left eye, early dry stage: Secondary | ICD-10-CM | POA: Diagnosis not present

## 2023-04-18 DIAGNOSIS — H353111 Nonexudative age-related macular degeneration, right eye, early dry stage: Secondary | ICD-10-CM | POA: Diagnosis not present

## 2023-05-06 IMAGING — US US THYROID
1 series · 12 of 25 positions shown · non-contrast
Comparison: No prior ultrasound

CLINICAL DATA: 70-year-old female with thyroid nodule on MR scan

EXAM:
THYROID ULTRASOUND
TECHNIQUE: Ultrasound examination of the thyroid gland and adjacent soft
tissues was performed.

[Series 1: us thyroid · 12 of 89 slices shown]
[im 4/89]
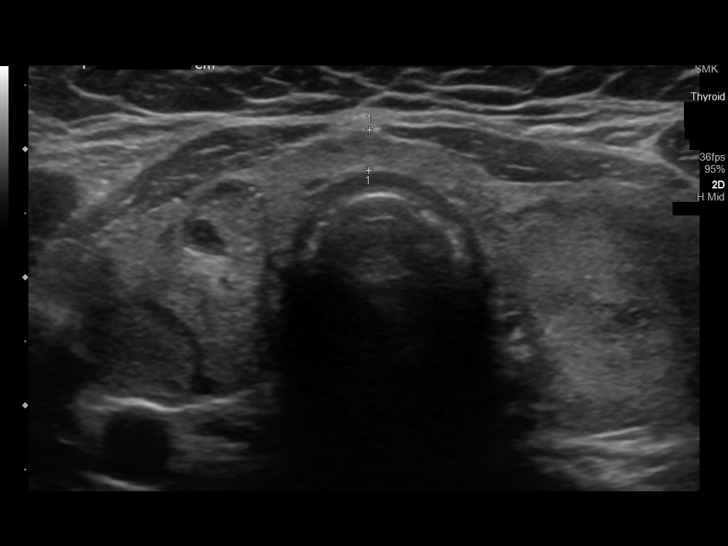
[im 12/89]
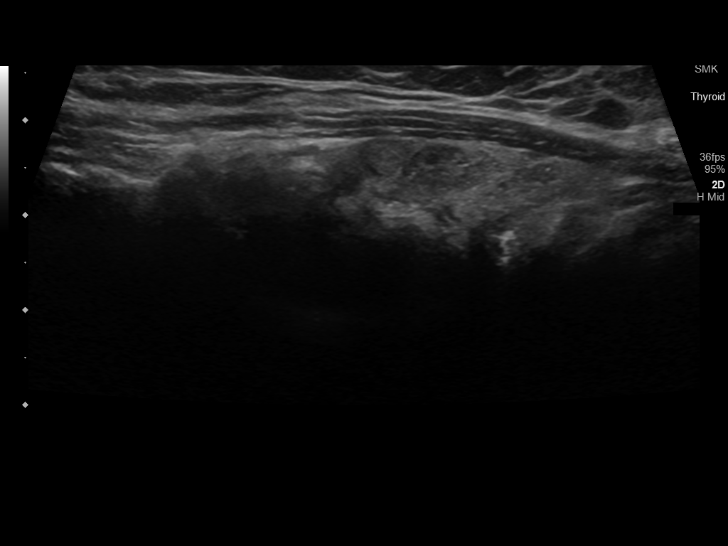
[im 19/89]
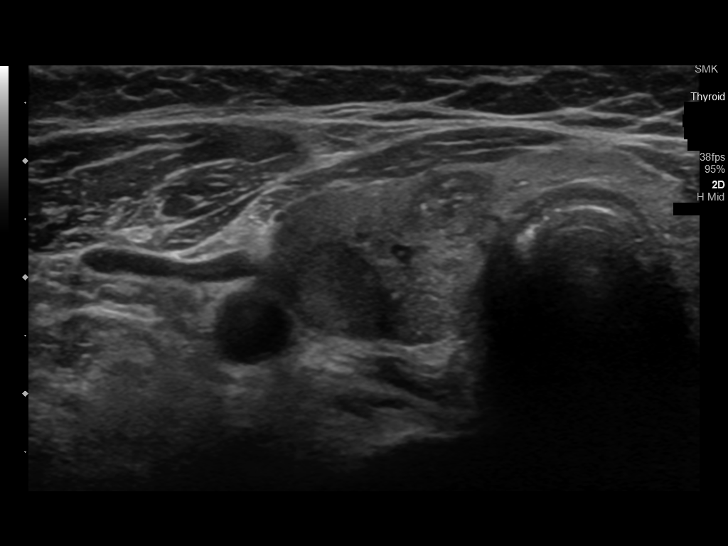
[im 26/89]
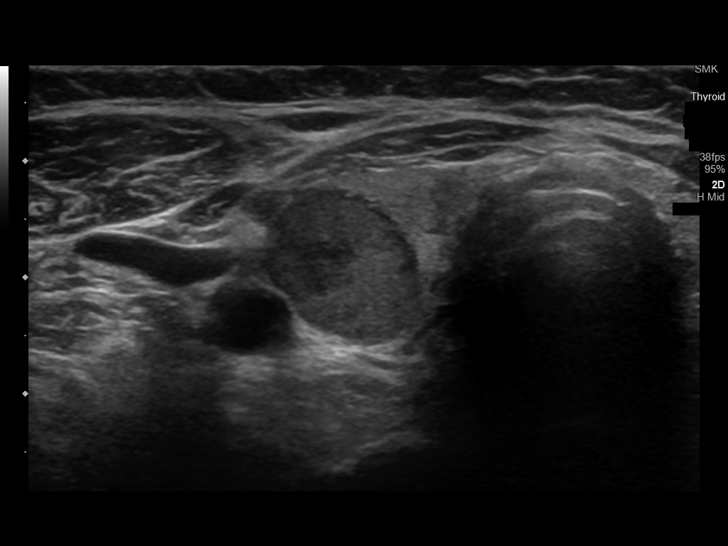
[im 34/89]
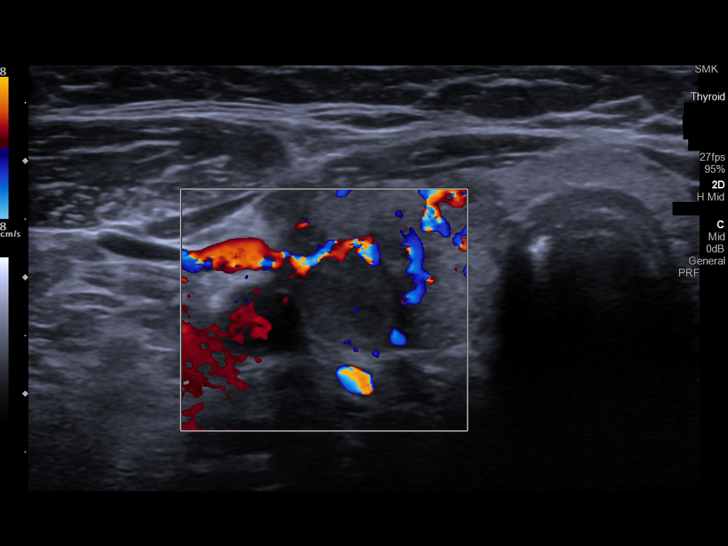
[im 41/89]
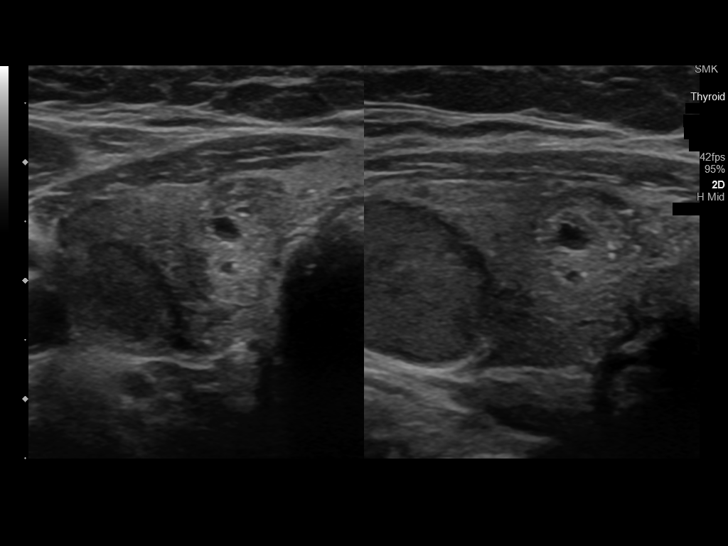
[im 48/89]
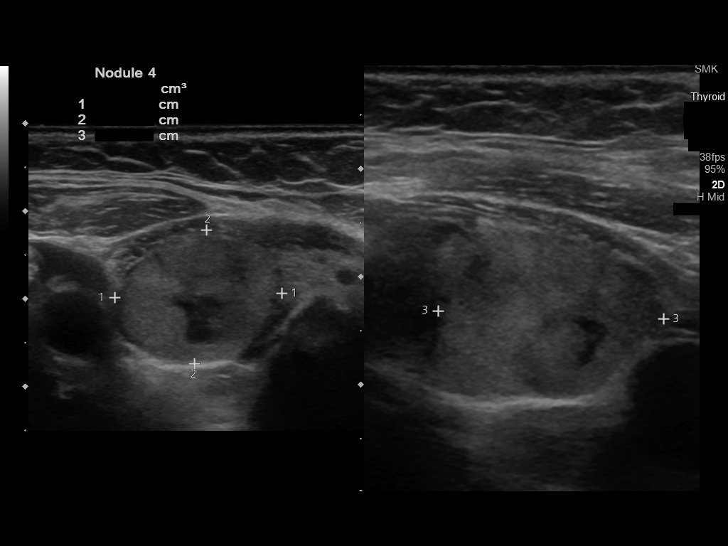
[im 56/89]
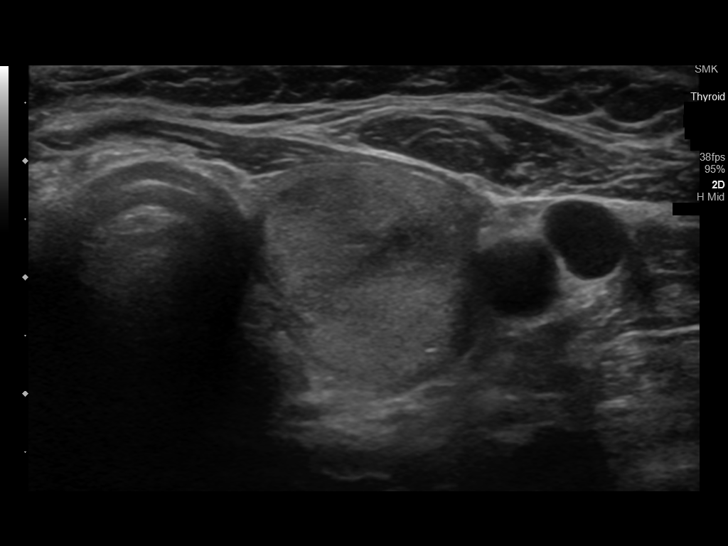
[im 63/89]
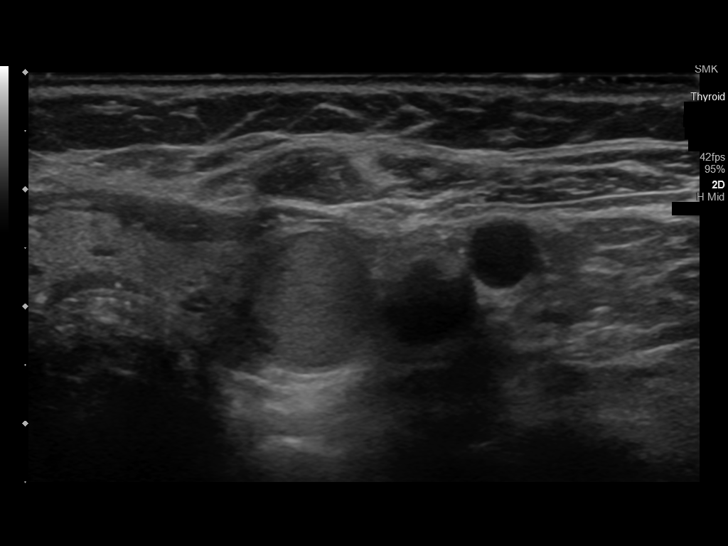
[im 70/89]
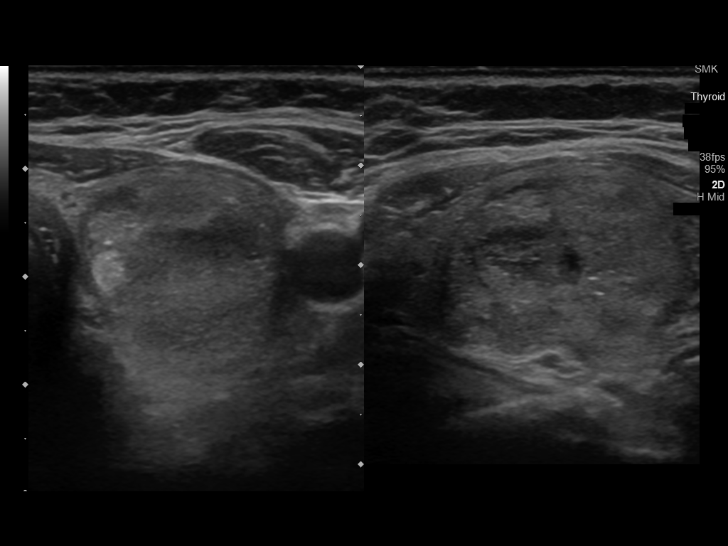
[im 78/89]
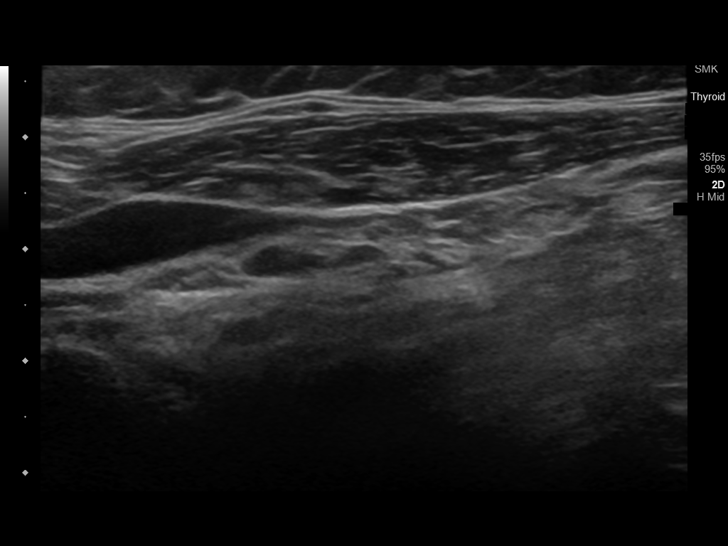
[im 85/89]
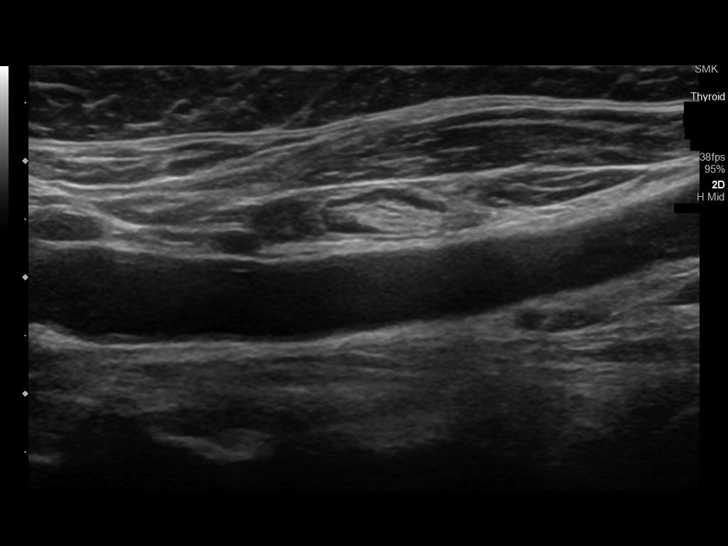

[12 of 25 positions shown; findings below may reference images not displayed]

FINDINGS: Parenchymal Echotexture: Mildly heterogenous

Isthmus: 0.3 cm

Right lobe: 4.8 cm x 1.8 cm x 1.9 cm

Left lobe: 4.7 cm x 2.0 cm x 2.0 cm

_________________________________________________________

Estimated total number of nodules >/= 1 cm: 6-10

Number of spongiform nodules >/=  2 cm not described below (TR1): 0

Number of mixed cystic and solid nodules >/= 1.5 cm not described
below (TR2): 0

_________________________________________________________

Nodule # 1:

Location: Right; Superior

Maximum size: 1.7 cm; Other 2 dimensions: 1.4 cm x 1.4 cm

Composition: solid/almost completely solid (2)

Echogenicity: hypoechoic (2)

Shape: not taller-than-wide (0)

Margins: smooth (0)

Echogenic foci: none (0)

ACR TI-RADS total points: 4.

ACR TI-RADS risk category: TR4 (4-6 points).

ACR TI-RADS recommendations:

Nodule meets criteria for biopsy

_________________________________________________________

Nodule # 2:

Location: Right; Mid

Maximum size: 1.1 cm; Other 2 dimensions: 1.0 cm x 1.0 cm

Composition: solid/almost completely solid (2)

Echogenicity: hypoechoic (2)

Shape: not taller-than-wide (0)

Margins: smooth (0)

Echogenic foci: none (0)

ACR TI-RADS total points: 4.

ACR TI-RADS risk category: TR4 (4-6 points).

ACR TI-RADS recommendations:

Nodule meets criteria for surveillance

_________________________________________________________

Nodule # 3:

Location: Right; Mid

Maximum size: 1.1 cm; Other 2 dimensions: 1.0 cm x 0.9 cm

Composition: cannot determine (2)

Echogenicity: isoechoic (1)

Shape: taller-than-wide (3)

Margins: ill-defined (0)

Echogenic foci: none (0)

ACR TI-RADS total points: 6.

ACR TI-RADS risk category: TR4 (4-6 points).

ACR TI-RADS recommendations:

Nodule meets criteria for surveillance

_________________________________________________________

Nodule # 4:

Location: Right; Inferior

Maximum size: 2.1 cm; Other 2 dimensions: 1.9 cm x 1.5 cm

Composition: solid/almost completely solid (2)

Echogenicity: isoechoic (1)

Shape: not taller-than-wide (0)

Margins: smooth (0)

Echogenic foci: none (0)

ACR TI-RADS total points: 3.

ACR TI-RADS risk category: TR3 (3 points).

ACR TI-RADS recommendations:

Nodule meets criteria for surveillance

_________________________________________________________

Nodule # 5:

Location: Left; Superior

Maximum size: 2.49 cm; Other 2 dimensions: 2.0 cm x 1.9 cm

Composition: solid/almost completely solid (2)

Echogenicity: isoechoic (1)

Shape: not taller-than-wide (0)

Margins: smooth (0)

Echogenic foci: none (0)

ACR TI-RADS total points: 3.

ACR TI-RADS risk category: TR3 (3 points).

ACR TI-RADS recommendations:

Nodule meets criteria for surveillance

_________________________________________________________

Nodule # 6:

Location: Left; Inferior

Maximum size: 1.2 cm; Other 2 dimensions: 21.2 cm x 1.0 cm

Composition: solid/almost completely solid (2)

Echogenicity: hypoechoic (2)

Shape: not taller-than-wide (0)

Margins: smooth (0)

Echogenic foci: none (0)

ACR TI-RADS total points: 4.

ACR TI-RADS risk category: TR4 (4-6 points).

ACR TI-RADS recommendations:

Nodule meets criteria for surveillance

_________________________________________________________

No adenopathy
IMPRESSION: Multinodular thyroid.

Right superior thyroid nodule (labeled 1, 1.7 cm, TR 4) meets
criteria for biopsy, as designated by the newly established ACR
TI-RADS criteria, and referral for biopsy is recommended.

Remainder of thyroid nodules labeled 2-6 meet criteria for
surveillance, as designated by the newly established ACR TI-RADS
criteria. Surveillance ultrasound study recommended to be performed
annually up to 5 years.

Recommendations follow those established by the new ACR TI-RADS
criteria ([HOSPITAL] [id number];[DATE]).

## 2023-05-22 ENCOUNTER — Ambulatory Visit: Payer: Medicare Other | Admitting: Nurse Practitioner

## 2023-05-27 NOTE — Patient Instructions (Signed)
 Be Involved in Caring For Your Health:  Taking Medications When medications are taken as directed, they can greatly improve your health. But if they are not taken as prescribed, they may not work. In some cases, not taking them correctly can be harmful. To help ensure your treatment remains effective and safe, understand your medications and how to take them. Bring your medications to each visit for review by your provider.  Your lab results, notes, and after visit summary will be available on My Chart. We strongly encourage you to use this feature. If lab results are abnormal the clinic will contact you with the appropriate steps. If the clinic does not contact you assume the results are satisfactory. You can always view your results on My Chart. If you have questions regarding your health or results, please contact the clinic during office hours. You can also ask questions on My Chart.  We at [facility] are grateful that you chose [place] to provide your care. We strive to provide evidence-based and compassionate care and are always looking for feedback. If you get a survey from the clinic please complete this so we can hear your opinions.  Heart-Healthy Eating Plan Many factors influence your heart health, including eating and exercise habits. Heart health is also called coronary health. Coronary risk increases with abnormal blood fat (lipid) levels. A heart-healthy eating plan includes limiting unhealthy fats, increasing healthy fats, limiting salt (sodium) intake, and making other diet and lifestyle changes. What is my plan? Your health care provider may recommend that: You limit your fat intake to _________% or less of your total calories each day. You limit your saturated fat intake to _________% or less of your total calories each day. You limit the amount of cholesterol in your diet to less than _________ mg per day. You limit the amount of sodium in your diet to less than _________  mg per day. What are tips for following this plan? Cooking Cook foods using methods other than frying. Baking, boiling, grilling, and broiling are all good options. Other ways to reduce fat include: Removing the skin from poultry. Removing all visible fats from meats. Steaming vegetables in water or broth. Meal planning  At meals, imagine dividing your plate into fourths: Fill one-half of your plate with vegetables and green salads. Fill one-fourth of your plate with whole grains. Fill one-fourth of your plate with lean protein foods. Eat 2-4 cups of vegetables per day. One cup of vegetables equals 1 cup (91 g) broccoli or cauliflower florets, 2 medium carrots, 1 large bell pepper, 1 large sweet potato, 1 large tomato, 1 medium white potato, 2 cups (150 g) raw leafy greens. Eat 1-2 cups of fruit per day. One cup of fruit equals 1 small apple, 1 large banana, 1 cup (237 g) mixed fruit, 1 large orange,  cup (82 g) dried fruit, 1 cup (240 mL) 100% fruit juice. Eat more foods that contain soluble fiber. Examples include apples, broccoli, carrots, beans, peas, and barley. Aim to get 25-30 g of fiber per day. Increase your consumption of legumes, nuts, and seeds to 4-5 servings per week. One serving of dried beans or legumes equals  cup (90 g) cooked, 1 serving of nuts is  oz (12 almonds, 24 pistachios, or 7 walnut halves), and 1 serving of seeds equals  oz (8 g). Fats Choose healthy fats more often. Choose monounsaturated and polyunsaturated fats, such as olive and canola oils, avocado oil, flaxseeds, walnuts, almonds, and seeds. Eat  more omega-3 fats. Choose salmon, mackerel, sardines, tuna, flaxseed oil, and ground flaxseeds. Aim to eat fish at least 2 times each week. Check food labels carefully to identify foods with trans fats or high amounts of saturated fat. Limit saturated fats. These are found in animal products, such as meats, butter, and cream. Plant sources of saturated fats  include palm oil, palm kernel oil, and coconut oil. Avoid foods with partially hydrogenated oils in them. These contain trans fats. Examples are stick margarine, some tub margarines, cookies, crackers, and other baked goods. Avoid fried foods. General information Eat more home-cooked food and less restaurant, buffet, and fast food. Limit or avoid alcohol. Limit foods that are high in added sugar and simple starches such as foods made using white refined flour (white breads, pastries, sweets). Lose weight if you are overweight. Losing just 5-10% of your body weight can help your overall health and prevent diseases such as diabetes and heart disease. Monitor your sodium intake, especially if you have high blood pressure. Talk with your health care provider about your sodium intake. Try to incorporate more vegetarian meals weekly. What foods should I eat? Fruits All fresh, canned (in natural juice), or frozen fruits. Vegetables Fresh or frozen vegetables (raw, steamed, roasted, or grilled). Green salads. Grains Most grains. Choose whole wheat and whole grains most of the time. Rice and pasta, including brown rice and pastas made with whole wheat. Meats and other proteins Lean, well-trimmed beef, veal, pork, and lamb. Chicken and [place] without skin. All fish and shellfish. Wild duck, rabbit, pheasant, and venison. Egg whites or low-cholesterol egg substitutes. Dried beans, peas, lentils, and tofu. Seeds and most nuts. Dairy Low-fat or nonfat cheeses, including ricotta and mozzarella. Skim or 1% milk (liquid, powdered, or evaporated). Buttermilk made with low-fat milk. Nonfat or low-fat yogurt. Fats and oils Non-hydrogenated (trans-free) margarines. Vegetable oils, including soybean, sesame, sunflower, olive, avocado, peanut, safflower, corn, canola, and cottonseed. Salad dressings or mayonnaise made with a vegetable oil. Beverages Water (mineral or sparkling). Coffee and tea. Unsweetened ice  tea. Diet beverages. Sweets and desserts Sherbet, gelatin, and fruit ice. Small amounts of dark chocolate. Limit all sweets and desserts. Seasonings and condiments All seasonings and condiments. The items listed above may not be a complete list of foods and beverages you can eat. Contact a dietitian for more options. What foods should I avoid? Fruits Canned fruit in heavy syrup. Fruit in cream or butter sauce. Fried fruit. Limit coconut. Vegetables Vegetables cooked in cheese, cream, or butter sauce. Fried vegetables. Grains Breads made with saturated or trans fats, oils, or whole milk. Croissants. Sweet rolls. Donuts. High-fat crackers, such as cheese crackers and chips. Meats and other proteins Fatty meats, such as hot dogs, ribs, sausage, bacon, rib-eye roast or steak. High-fat deli meats, such as salami and bologna. Caviar. Domestic duck and goose. Organ meats, such as liver. Dairy Cream, sour cream, cream cheese, and creamed cottage cheese. Whole-milk cheeses. Whole or 2% milk (liquid, evaporated, or condensed). Whole buttermilk. Cream sauce or high-fat cheese sauce. Whole-milk yogurt. Fats and oils Meat fat, or shortening. Cocoa butter, hydrogenated oils, palm oil, coconut oil, palm kernel oil. Solid fats and shortenings, including bacon fat, salt pork, lard, and butter. Nondairy cream substitutes. Salad dressings with cheese or sour cream. Beverages Regular sodas and any drinks with added sugar. Sweets and desserts Frosting. Pudding. Cookies. Cakes. Pies. Milk chocolate or white chocolate. Buttered syrups. Full-fat ice cream or ice cream drinks. The items listed above may  not be a complete list of foods and beverages to avoid. Contact a dietitian for more information. Summary Heart-healthy meal planning includes limiting unhealthy fats, increasing healthy fats, limiting salt (sodium) intake and making other diet and lifestyle changes. Lose weight if you are overweight. Losing just  5-10% of your body weight can help your overall health and prevent diseases such as diabetes and heart disease. Focus on eating a balance of foods, including fruits and vegetables, low-fat or nonfat dairy, lean protein, nuts and legumes, whole grains, and heart-healthy oils and fats. This information is not intended to replace advice given to you by your health care provider. Make sure you discuss any questions you have with your health care provider. Document Revised: 06/07/2021 Document Reviewed: 06/07/2021 Elsevier Patient Education  2024 [employer].

## 2023-05-29 ENCOUNTER — Encounter: Payer: Self-pay | Admitting: Nurse Practitioner

## 2023-05-29 ENCOUNTER — Ambulatory Visit (INDEPENDENT_AMBULATORY_CARE_PROVIDER_SITE_OTHER): Payer: Medicare Other | Admitting: Nurse Practitioner

## 2023-05-29 VITALS — BP 142/78 | HR 67 | Temp 97.5°F | Ht 62.6 in | Wt 154.2 lb

## 2023-05-29 DIAGNOSIS — Z780 Asymptomatic menopausal state: Secondary | ICD-10-CM

## 2023-05-29 DIAGNOSIS — I10 Essential (primary) hypertension: Secondary | ICD-10-CM | POA: Diagnosis not present

## 2023-05-29 DIAGNOSIS — E7800 Pure hypercholesterolemia, unspecified: Secondary | ICD-10-CM

## 2023-05-29 DIAGNOSIS — E041 Nontoxic single thyroid nodule: Secondary | ICD-10-CM

## 2023-05-29 MED ORDER — CHLORTHALIDONE 15 MG PO TABS: 15.0000 mg | ORAL_TABLET | Freq: Every day | ORAL | 1 refills | Status: DC

## 2023-05-29 NOTE — Progress Notes (Signed)
 BP (!) 142/78 (BP Location: Left Arm, Patient Position: Sitting)   Pulse 67   Temp (!) 97.5 F (36.4 C) (Oral)   Ht 5' 2.6 (1.59 m)   Wt 154 lb 3.2 oz (69.9 kg)   LMP  (LMP Unknown)   SpO2 98%   BMI 27.67 kg/m    Subjective:    Patient ID: Laura Gibson, female    DOB: 1950-08-18, 73 y.o.   MRN: [id number]  HPI: Laura Gibson is a 73 y.o. female  Chief Complaint  Patient presents with   Hypertension   HYPERTENSION without Chronic Kidney Disease Presents today for BP follow-up.  Started Amlodipine  2.5 MG on 04/06/23 and she continues Losartan  100 MG daily.  No current statin therapy, she prefers not to take this.  Has thyroid  nodule present right superior thyroid , ENT follows, she is to have annual ultrasounds for 5 years.  Next due 12/29/23 (#2).  Continues on Vitamin D  daily for her osteopenia. Hypertension status: uncontrolled  Satisfied with current treatment? yes Duration of hypertension: chronic BP monitoring frequency:  daily BP range: December 28th 158/76, January 1st 156/67 -- she doubled dose Amlodipine  on January 2nd but then she felt terrible the second day on 5 MG, but BP came down to 136/76, January 10th 132/69, 11th 131/69, this morning without pain pills and nervous about PCP visit 151/75 - she has stopped taking Amlodipine  BP medication side effects:  no Medication compliance: good compliance Previous BP meds:as above Aspirin: no Recurrent headaches: no Visual changes: no Palpitations: no Dyspnea: no Chest pain: no Lower extremity edema: no Dizzy/lightheaded: no   Relevant past medical, surgical, family and social history reviewed and updated as indicated. Interim medical history since our last visit reviewed. Allergies and medications reviewed and updated.  Review of Systems  Constitutional:  Negative for activity change, appetite change, diaphoresis, fatigue and fever.  Respiratory:  Negative for cough, chest tightness and shortness of breath.    Cardiovascular:  Negative for chest pain, palpitations and leg swelling.  Neurological: Negative.   Psychiatric/Behavioral: Negative.      Per HPI unless specifically indicated above     Objective:    BP (!) 142/78 (BP Location: Left Arm, Patient Position: Sitting)   Pulse 67   Temp (!) 97.5 F (36.4 C) (Oral)   Ht 5' 2.6 (1.59 m)   Wt 154 lb 3.2 oz (69.9 kg)   LMP  (LMP Unknown)   SpO2 98%   BMI 27.67 kg/m   Wt Readings from Last 3 Encounters:  05/29/23 154 lb 3.2 oz (69.9 kg)  04/06/23 158 lb 6.4 oz (71.8 kg)  03/02/23 160 lb 6.4 oz (72.8 kg)    Physical Exam Vitals and nursing note reviewed.  Constitutional:      General: She is awake. She is not in acute distress.    Appearance: She is well-developed and well-groomed. She is not ill-appearing or toxic-appearing.  HENT:     Head: Normocephalic.     Right Ear: Hearing normal.     Left Ear: Hearing normal.  Eyes:     General: Lids are normal.        Right eye: No discharge.        Left eye: No discharge.     Conjunctiva/sclera: Conjunctivae normal.     Pupils: Pupils are equal, round, and reactive to light.  Neck:     Thyroid : No thyromegaly.     Vascular: No carotid bruit.  Cardiovascular:  Rate and Rhythm: Normal rate and regular rhythm.     Heart sounds: Normal heart sounds. No murmur heard.    No gallop.  Pulmonary:     Effort: Pulmonary effort is normal. No accessory muscle usage or respiratory distress.     Breath sounds: Normal breath sounds.  Abdominal:     General: Bowel sounds are normal.     Palpations: Abdomen is soft. There is no hepatomegaly or splenomegaly.  Musculoskeletal:     Cervical back: Normal range of motion and neck supple.     Right lower leg: No edema.     Left lower leg: No edema.  Lymphadenopathy:     Cervical: No cervical adenopathy.  Skin:    General: Skin is warm and dry.  Neurological:     Mental Status: She is alert and oriented to person, place, and time.   Psychiatric:        Attention and Perception: Attention normal.        Mood and Affect: Mood normal.        Speech: Speech normal.        Behavior: Behavior normal. Behavior is cooperative.        Thought Content: Thought content normal.    Results for orders placed or performed in visit on 01/12/23  Comprehensive metabolic panel   Collection Time: 01/12/23 10:36 AM  Result Value Ref Range   Glucose 98 70 - 99 mg/dL   BUN 15 8 - 27 mg/dL   Creatinine, Ser 9.27 0.57 - 1.00 mg/dL   eGFR 88 >40 fO/fpw/8.26   BUN/Creatinine Ratio 21 12 - 28   Sodium 141 134 - 144 mmol/L   Potassium 4.5 3.5 - 5.2 mmol/L   Chloride 102 96 - 106 mmol/L   CO2 25 20 - 29 mmol/L   Calcium 9.9 8.7 - 10.3 mg/dL   Total Protein 6.8 6.0 - 8.5 g/dL   Albumin 4.4 3.8 - 4.8 g/dL   Globulin, Total 2.4 1.5 - 4.5 g/dL   Bilirubin Total 0.2 0.0 - 1.2 mg/dL   Alkaline Phosphatase 85 44 - 121 IU/L   AST 14 0 - 40 IU/L   ALT 20 0 - 32 IU/L      Assessment & Plan:   Problem List Items Addressed This Visit       Cardiovascular and Mediastinum   Essential hypertension - Primary   Chronic, ongoing.  BP continues to have elevations on SBP, tolerating Losartan  but she did not tolerate increase to 5 MG Amlodipine  although this did help improve SBP. Will continue Losartan  100 MG daily and add on Chlorthalidone  15 MG daily.  Educated her on this medication and side effects to monitor for and alert PCP of, may offer bone protection as well.  Recommend she monitor BP at least a few mornings a week at home and document.  DASH diet at home.  Labs today: up to date.  ?some ISH present.   - Would avoid BB due to lower baseline HR (60's) - Could consider low dose Clonidine or Hydralazine if needed in future.      Relevant Medications   chlorthalidone  (THALITONE ) 15 MG tablet     Follow up plan: Return for as scheduled March 6th for physical.

## 2023-05-29 NOTE — Assessment & Plan Note (Signed)
 Chronic, ongoing.  BP continues to have elevations on SBP, tolerating Losartan  but she did not tolerate increase to 5 MG Amlodipine  although this did help improve SBP. Will continue Losartan  100 MG daily and add on Chlorthalidone  15 MG daily.  Educated her on this medication and side effects to monitor for and alert PCP of, may offer bone protection as well.  Recommend she monitor BP at least a few mornings a week at home and document.  DASH diet at home.  Labs today: up to date.  ?some ISH present.   - Would avoid BB due to lower baseline HR (60's) - Could consider low dose Clonidine or Hydralazine if needed in future.

## 2023-05-30 ENCOUNTER — Telehealth: Payer: Self-pay

## 2023-05-30 MED ORDER — HYDROCHLOROTHIAZIDE 12.5 MG PO TABS: 12.5000 mg | ORAL_TABLET | Freq: Every day | ORAL | 3 refills | Status: DC

## 2023-05-30 NOTE — Telephone Encounter (Signed)
 Routing to provider. Is there anything else we can send in for the patient?

## 2023-05-30 NOTE — Telephone Encounter (Signed)
Called and notified patient of medication change.  

## 2023-05-30 NOTE — Telephone Encounter (Signed)
 Copied from CRM [id number]. Topic: General - Other >> May 30, 2023  1:29 PM Yvone Marda Blow wrote: chlorthalidone  (THALITONE ) 15 MG tablet .SABRASABRAPt Rhem called and stated she can not afford this medication it is too expensive. She needs an alternative medication. Please advise  Pt also asking for a call back

## 2023-06-23 IMAGING — US US FNA BIOPSY THYROID 1ST LESION
1 series · 14 of 14 positions shown · non-contrast
Comparison: US thyroid 09/17/21

MEDICATIONS:
4 mL 1% lidocaine

COMPLICATIONS:
None immediate.

INDICATION: Indeterminate right superior thyroid nodule

EXAM:
ULTRASOUND GUIDED FINE NEEDLE ASPIRATION OF INDETERMINATE THYROID
NODULE
TECHNIQUE: Informed written consent was obtained from the patient after a
discussion of the risks, benefits and alternatives to treatment.
Questions regarding the procedure were encouraged and answered. A
timeout was performed prior to the initiation of the procedure.

[Series 1: us thyroid biopsy · 14 acquisitions, 14 frames shown]
[im 1/14]
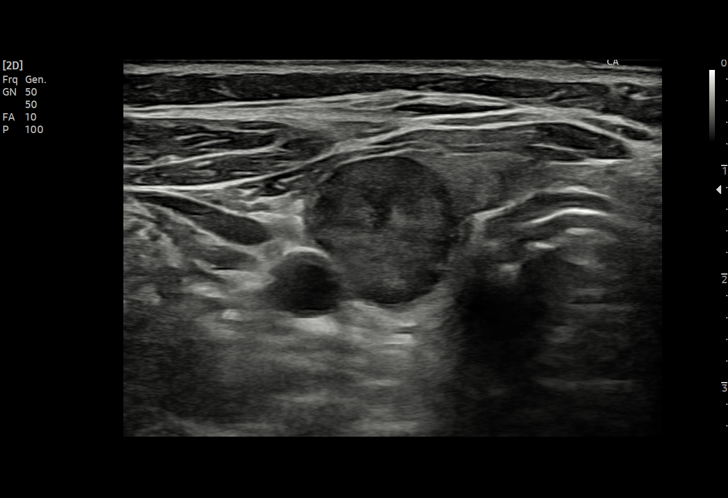
[im 2/14]
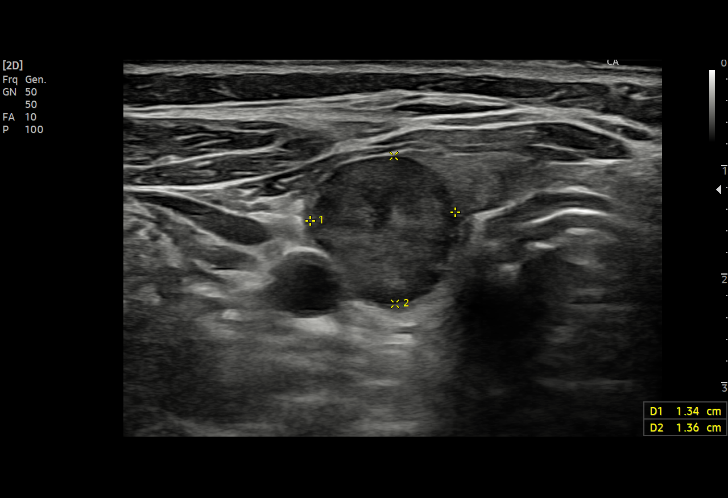
[im 3/14]
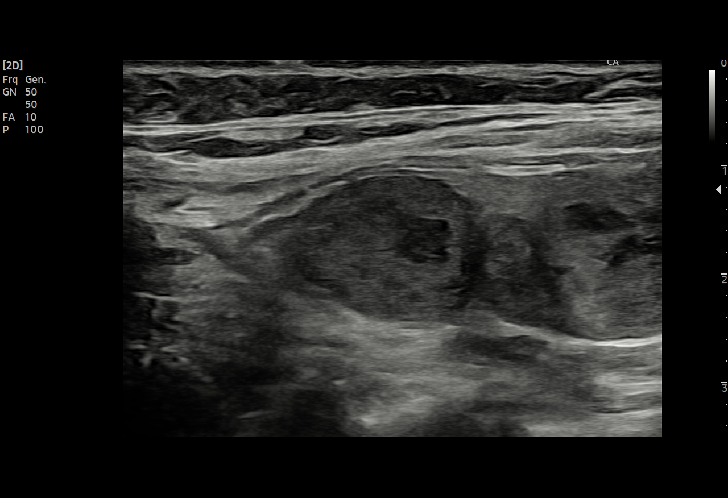
[im 4/14]
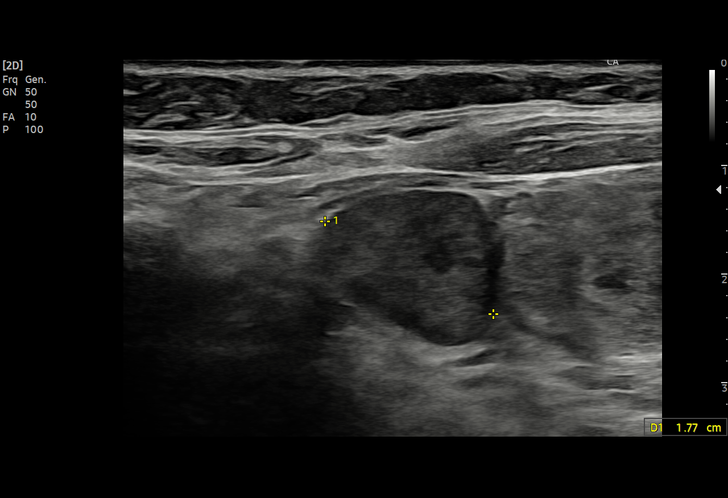
[im 5/14]
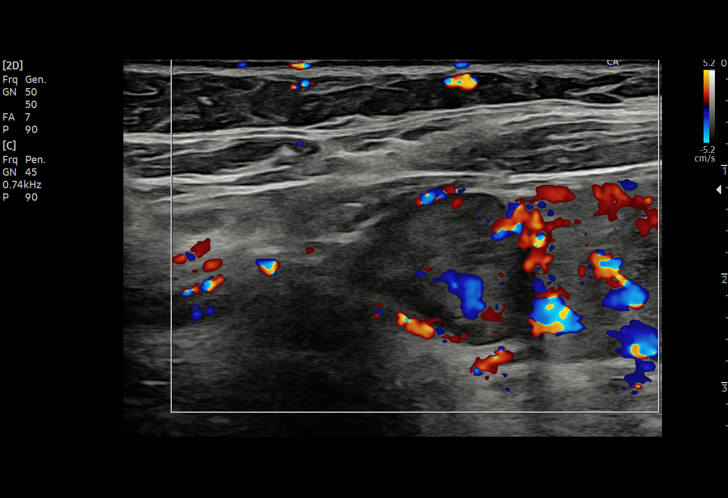
[im 6/14]
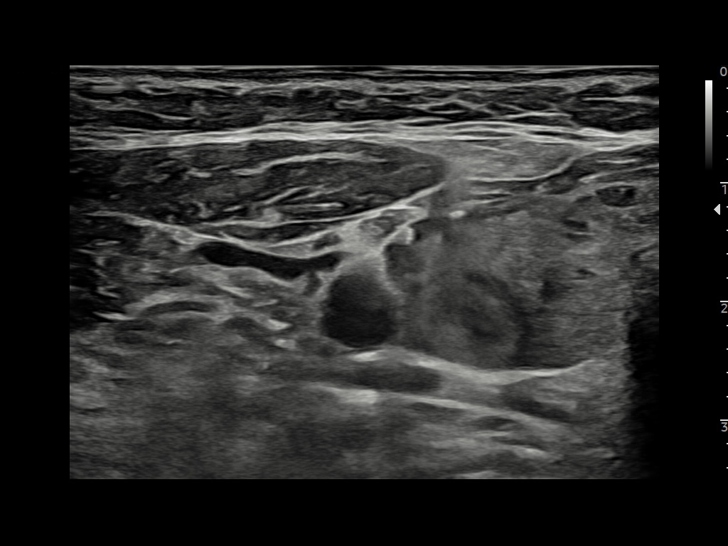
[im 7/14]
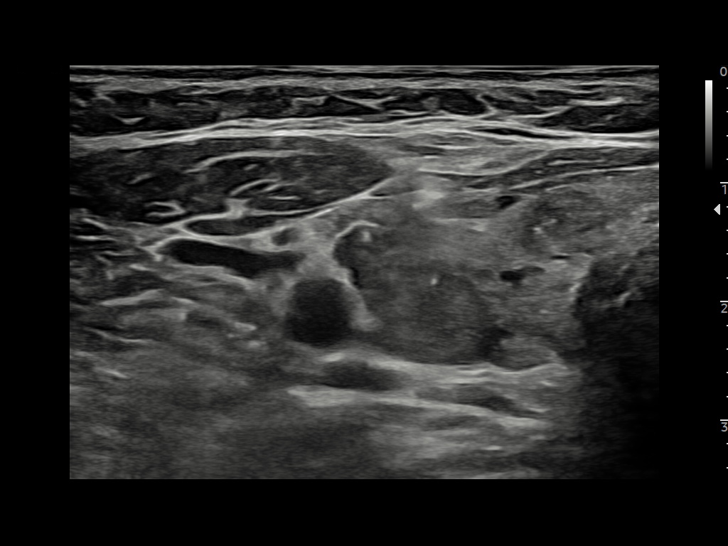
[im 8/14]
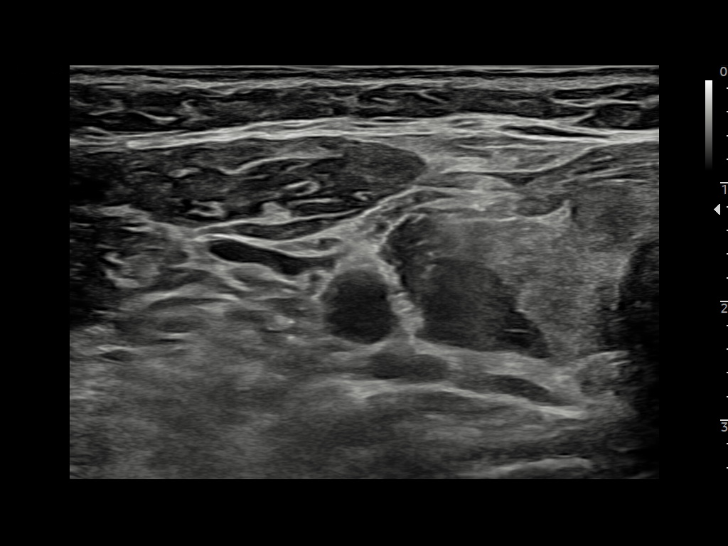
[im 9/14]
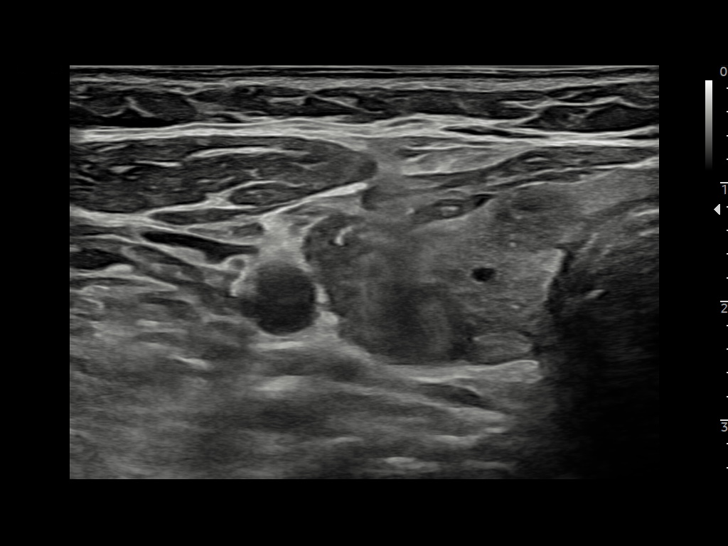
[im 10/14]
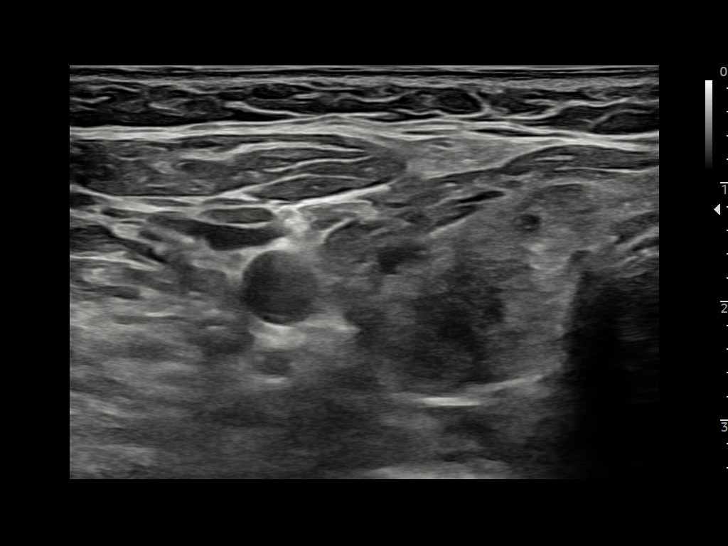
[im 11/14]
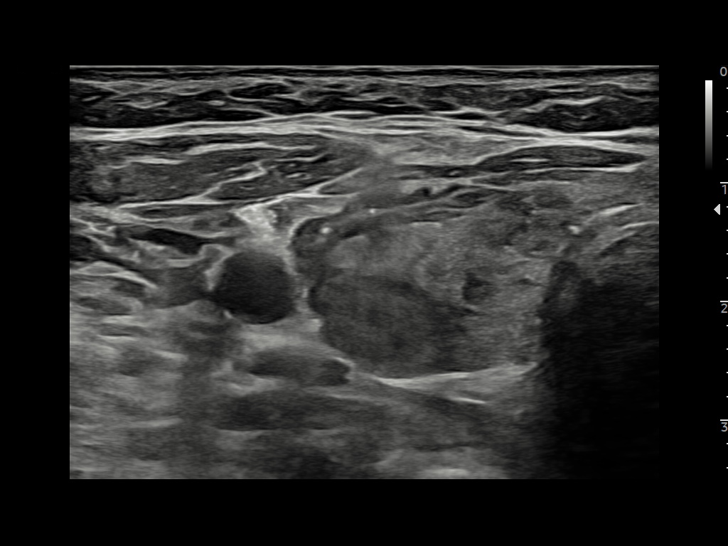
[im 12/14]
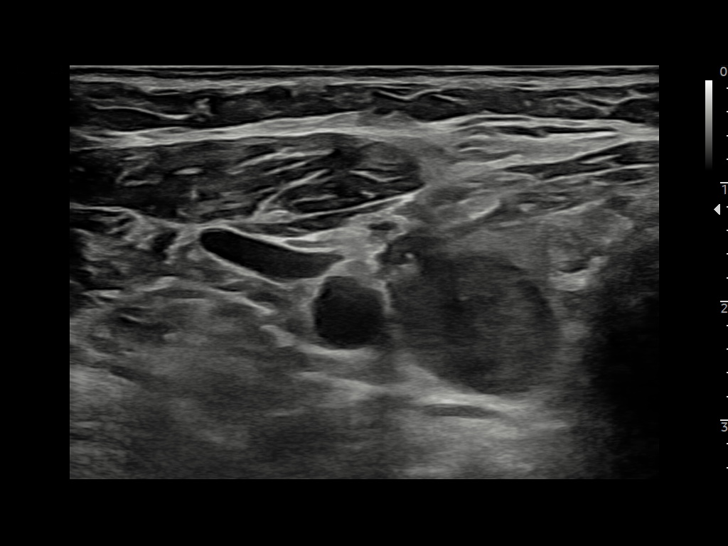
[im 13/14]
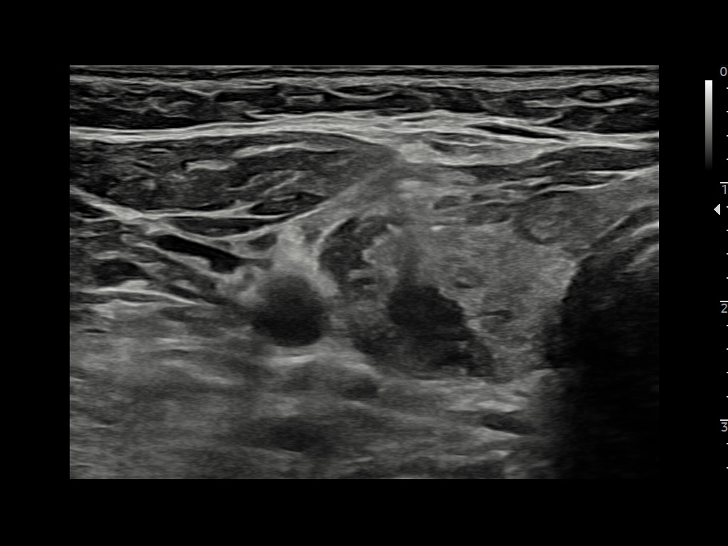
[im 14/14]
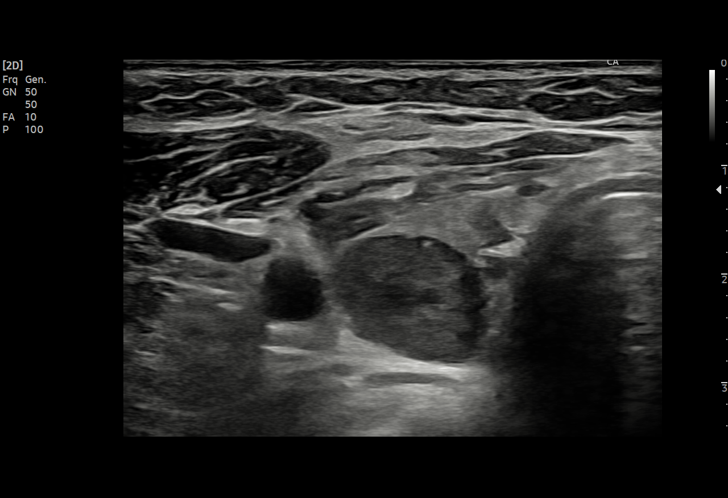

[14 of 14 positions shown; findings below may reference images not displayed]

Pre-procedural ultrasound scanning demonstrated unchanged size and
appearance of the indeterminate nodule within the right superior
thyroid lobe

The procedure was planned. The neck was prepped in the usual sterile
fashion, and a sterile drape was applied covering the operative
field. A timeout was performed prior to the initiation of the
procedure. Local anesthesia was provided with 1% lidocaine.

Under direct ultrasound guidance, 8 FNA biopsies were performed of
the indeterminate right superior thyroid nodule with a 27 gauge
needle. Multiple ultrasound images were saved for procedural
documentation purposes. The samples were prepared and submitted to
pathology.

Limited post procedural scanning was negative for hematoma or
additional complication. Dressings were placed. The patient
tolerated the above procedures procedure well without immediate
postprocedural complication.
FINDINGS: Nodule reference number based on prior diagnostic ultrasound: 1

Maximum size: 1.7 cm

Location: Right; Superior

ACR TI-RADS risk category: TR4 (4-6 points)

Reason for biopsy: meets ACR TI-RADS criteria

Ultrasound imaging confirms appropriate placement of the needles
within the thyroid nodule.
IMPRESSION: Successful ultrasound guided FNA biopsy of 1.7 cm RIGHT superior
TR-4 thyroid nodule

Read by Jo, Fredd

## 2023-07-16 NOTE — Patient Instructions (Incomplete)
 Please call to schedule your mammogram and/or bone density: [facility]  Address: [address], [place], [place] [address] Phone: [phone]  [facility] [address] [place],  [place]  [address] Phone: [phone]    Osteopenia  Osteopenia is a loss of thickness (density) inside the bones. Another name for osteopenia is low bone mass. Mild osteopenia is a normal part of aging. It is not a disease, and it does not cause symptoms. However, if you have osteopenia and continue to lose bone mass, you could develop a condition that causes the bones to become thin and break more easily (osteoporosis). Osteoporosis can cause you to lose some height, have back pain, and have a stooped posture. Although osteopenia is not a disease, making changes to your lifestyle and diet can help to prevent osteopenia from developing into osteoporosis. What are the causes? Osteopenia is caused by loss of calcium in the bones. Bones are constantly changing. Old bone cells are continually being replaced with new bone cells. This process builds new bone. The mineral calcium is needed to build new bone and maintain bone density. Bone density is usually highest around age 55. After that, most people's bodies cannot replace all the bone they have lost with new bone. What increases the risk? You are more likely to develop this condition if: You are older than age 68. You are a woman who went through menopause early. You have a long illness that keeps you in bed. You do not get enough exercise. You lack certain nutrients (malnutrition). You have an overactive thyroid gland (hyperthyroidism). You use products that contain nicotine or tobacco, such as cigarettes, e-cigarettes and chewing tobacco, or you drink a lot of alcohol. You are taking medicines that weaken the bones, such as steroids. What are the signs or symptoms? This condition  does not cause any symptoms. You may have a slightly higher risk for bone breaks (fractures), so getting fractures more easily than normal may be an indication of osteopenia. How is this diagnosed? This condition may be diagnosed based on an X-ray exam that measures bone density (dual-energy X-ray absorptiometry, or DEXA). This test can measure bone density in your hips, spine, and wrists. Osteopenia has no symptoms, so this condition is usually diagnosed after a routine bone density screening test is done for osteoporosis. This routine screening is usually done for: Women who are age 69 or older. Men who are age 59 or older. If you have risk factors for osteopenia, you may have the screening test at an earlier age. How is this treated? Making dietary and lifestyle changes can lower your risk for osteoporosis. If you have severe osteopenia that is close to becoming osteoporosis, this condition can be treated with medicines and dietary supplements such as calcium and vitamin D. These supplements help to rebuild bone density. Follow these instructions at home: Eating and drinking Eat a diet that is high in calcium and vitamin D. Calcium is found in dairy products, beans, salmon, and leafy green vegetables like spinach and broccoli. Look for foods that have vitamin D and calcium added to them (fortified foods), such as orange juice, cereal, and bread.  Lifestyle Do 30 minutes or more of a weight-bearing exercise every day, such as walking, jogging, or playing a sport. These types of exercises strengthen the bones. Do not use any products that contain nicotine or tobacco, such as cigarettes, e-cigarettes, and chewing tobacco. If you  need help quitting, ask your health care provider. Do not drink alcohol if: Your health care provider tells you not to drink. You are pregnant, may be pregnant, or are planning to become pregnant. If you drink alcohol: Limit how much you use to: 0-1 drink a day for  women. 0-2 drinks a day for men. Be aware of how much alcohol is in your drink. In the [place] one drink equals one 12 oz bottle of beer (355 mL), one 5 oz glass of wine (148 mL), or one 1 oz glass of hard liquor (44 mL). General instructions Take over-the-counter and prescription medicines only as told by your health care provider. These include vitamins and supplements. Take precautions at home to lower your risk of falling, such as: Keeping rooms well-lit and free of clutter, such as cords. Installing safety rails on stairs. Using rubber mats in the bathroom or other areas that are often wet or slippery. Keep all follow-up visits. This is important. Contact a health care provider if: You have not had a bone density screening for osteoporosis and you are: A woman who is age 11 or older. A man who is age 74 or older. You are a postmenopausal woman who has not had a bone density screening for osteoporosis. You are older than age 81 and you want to know if you should have bone density screening for osteoporosis. Summary Osteopenia is a loss of thickness (density) inside the bones. Another name for osteopenia is low bone mass. Osteopenia is not a disease, but it may increase your risk for a condition that causes the bones to become thin and break more easily (osteoporosis). You may be at risk for osteopenia if you are older than age 78 or if you are a woman who went through early menopause. Osteopenia does not cause any symptoms, but it can be diagnosed with a bone density screening test. Dietary and lifestyle changes are the first treatment for osteopenia. These may lower your risk for osteoporosis. This information is not intended to replace advice given to you by your health care provider. Make sure you discuss any questions you have with your health care provider. Document Revised: 01/17/2023 Document Reviewed: 01/17/2023 Elsevier Patient Education  2024 [employer].

## 2023-07-20 ENCOUNTER — Encounter: Payer: Self-pay | Admitting: Nurse Practitioner

## 2023-07-20 ENCOUNTER — Ambulatory Visit (INDEPENDENT_AMBULATORY_CARE_PROVIDER_SITE_OTHER): Payer: Medicare Other | Admitting: Nurse Practitioner

## 2023-07-20 VITALS — BP 142/85 | HR 67 | Ht 63.0 in | Wt 150.6 lb

## 2023-07-20 DIAGNOSIS — E041 Nontoxic single thyroid nodule: Secondary | ICD-10-CM | POA: Diagnosis not present

## 2023-07-20 DIAGNOSIS — Z0000 Encounter for general adult medical examination without abnormal findings: Secondary | ICD-10-CM | POA: Diagnosis not present

## 2023-07-20 DIAGNOSIS — I10 Essential (primary) hypertension: Secondary | ICD-10-CM

## 2023-07-20 DIAGNOSIS — M542 Cervicalgia: Secondary | ICD-10-CM

## 2023-07-20 DIAGNOSIS — M8580 Other specified disorders of bone density and structure, unspecified site: Secondary | ICD-10-CM

## 2023-07-20 DIAGNOSIS — E7800 Pure hypercholesterolemia, unspecified: Secondary | ICD-10-CM | POA: Diagnosis not present

## 2023-07-20 DIAGNOSIS — Z780 Asymptomatic menopausal state: Secondary | ICD-10-CM

## 2023-07-20 MED ORDER — AMLODIPINE BESYLATE 5 MG PO TABS: 5.0000 mg | ORAL_TABLET | Freq: Every day | ORAL | 1 refills | Status: DC

## 2023-07-20 NOTE — Progress Notes (Signed)
 BP (!) 142/85 (BP Location: Left Arm, Patient Position: Sitting, Cuff Size: Large)   Pulse 67   Ht 5\' 3"  (1.6 m)   Wt 150 lb 9.6 oz (68.3 kg)   LMP  (LMP Unknown)   SpO2 96%   BMI 26.68 kg/m    Subjective:    Patient ID: Laura Gibson, female    DOB: April 05, 1951, 73 y.o.   MRN: [id number]  HPI: Laura Gibson is a 73 y.o. female presenting on 07/20/2023 for Medicare Wellness and comprehensive medical examination. Current medical complaints include:none  She currently lives with: self Menopausal Symptoms: no  Chronic neck pain, followed with Dr. Yves Dill and obtained injections -- for arthritis in neck.  They discussed next step would be nerve burn, which will be in two months.  HYPERTENSION & HLD Continues on Losartan 100 MG daily, did take HCTZ but this caused her to urinate a lot and did not help BP.  Would like to re trial Amlodipine, as this did bring down SBP.  Continues red yeast rice and plant sterols.   Thyroid nodule present right superior thyroid, ENT follows, she is to have annual ultrasounds for 5 years. Next due 12/29/23 (will be #3). Hypertension status: stable  Satisfied with current treatment? yes Duration of hypertension: chronic BP monitoring frequency:  rarely -- bought her BP cuff in past year BP range: 2/10 = 153/76, 2/12 = 162/82, 2/28 = 148/77, 3/2 = 149/70, 3/4 = 118/66, 3/5 = 142/83 BP medication side effects:  no Medication compliance: good compliance Aspirin: no Recurrent headaches: no Visual changes: no Palpitations: no Dyspnea: no Chest pain: no Lower extremity edema: no Dizzy/lightheaded: no  The 10-year ASCVD risk score (Arnett DK, et al., 2019) is: 23.5%   Values used to calculate the score:     Age: 73 years     Sex: Female     Is Non-Hispanic African American: No     Diabetic: No     Tobacco smoker: No     Systolic Blood Pressure: 142 mmHg     Is BP treated: Yes     HDL Cholesterol: 63 mg/dL     Total Cholesterol: 254 mg/dL    OSTEOPENIA Continues to take Vitamin D daily. Continues supplements daily. Satisfied with current treatment?: yes Adequate calcium & vitamin D: yes Weight bearing exercises: yes      05/29/2023   10:22 AM 03/02/2023   11:14 AM 01/12/2023    9:47 AM 07/13/2022    2:51 PM 05/10/2022    3:19 PM  Depression screen PHQ 2/9  Decreased Interest 0 0 0 0 0  Down, Depressed, Hopeless 0 0 0 0 0  PHQ - 2 Score 0 0 0 0 0  Altered sleeping 0 0 0 1 1  Tired, decreased energy 0 0 0 0 0  Change in appetite 0 0 0 0 0  Feeling bad or failure about yourself  0 0 0 0 0  Trouble concentrating 0 0 0 0 0  Moving slowly or fidgety/restless 0 0 0 0 0  Suicidal thoughts 0 0 0 0 0  PHQ-9 Score 0 0 0 1 1  Difficult doing work/chores Not difficult at all Not difficult at all Not difficult at all Not difficult at all Not difficult at all       05/29/2023   10:22 AM 03/02/2023   11:14 AM 01/12/2023    9:47 AM 07/13/2022    2:52 PM  GAD 7 : Generalized Anxiety Score  Nervous, Anxious, on Edge 0 0 0 0  Control/stop worrying 0 0 0 0  Worry too much - different things 0 0 0 0  Trouble relaxing 0 0 0 0  Restless 0 0 0 0  Easily annoyed or irritable 0 0 0 0  Afraid - awful might happen 0 0 0 0  Total GAD 7 Score 0 0 0 0  Anxiety Difficulty Not difficult at all Not difficult at all Not difficult at all Not difficult at all     Functional Status Survey: Is the patient deaf or have difficulty hearing?: No Does the patient have difficulty seeing, even when wearing glasses/contacts?: No Does the patient have difficulty concentrating, remembering, or making decisions?: No Does the patient have difficulty walking or climbing stairs?: No Does the patient have difficulty dressing or bathing?: No Does the patient have difficulty doing errands alone such as visiting a doctor's office or shopping?: No      07/12/2021    1:09 PM 05/10/2022    3:19 PM 07/13/2022    2:51 PM 01/12/2023    9:47 AM 05/29/2023   10:22  AM  Fall Risk  Falls in the past year? 0 0 0 0 0  Was there an injury with Fall? 0 0 0 0 0  Fall Risk Category Calculator 0 0 0 0 0  Fall Risk Category (Retired) Low Low     (RETIRED) Patient Fall Risk Level Low fall risk      Patient at Risk for Falls Due to No Fall Risks No Fall Risks No Fall Risks No Fall Risks No Fall Risks  Fall risk Follow up Falls evaluation completed Falls evaluation completed Falls evaluation completed Falls evaluation completed Falls evaluation completed    Past Medical History:  Past Medical History:  Diagnosis Date   Actinic keratosis    Arthritis of neck    Lichen sclerosus     Surgical History:  Past Surgical History:  Procedure Laterality Date   BUNIONECTOMY     LESION EXCISION     for lichen sclerosus    Medications:  Current Outpatient Medications on File Prior to Visit  Medication Sig   acetaminophen (TYLENOL) 500 MG tablet Take 500 mg by mouth every 6 (six) hours as needed.   calcium-vitamin D (OSCAL WITH D) 250-125 MG-UNIT tablet Take 1 tablet by mouth daily.   CINNAMON PO Take 1 tablet by mouth daily.   co-enzyme Q-10 30 MG capsule Take 1 capsule by mouth daily.   ibuprofen (ADVIL) 200 MG tablet Take 200 mg by mouth every 6 (six) hours as needed.   losartan (COZAAR) 100 MG tablet Take 1 tablet (100 mg total) by mouth daily.   MAGNESIUM SULFATE PO Take 1 tablet by mouth daily.   Menthol, Topical Analgesic, (ICY HOT) 5 % PTCH Apply topically.   Multiple Vitamins-Minerals (MULTIVITAMIN ADULT PO) Take 1 tablet by mouth daily.   OVER THE COUNTER MEDICATION Beet Root Powder   Plant Sterols and Stanols 450 MG TABS Take 2 tablets by mouth daily.   Red Yeast Rice Extract 600 MG CAPS Take 2 capsules by mouth daily.   No current facility-administered medications on file prior to visit.    Allergies:  No Known Allergies  Social History:  Social History   Socioeconomic History   Marital status: Married    Spouse name: Not on file   Number  of children: Not on file   Years of education: Not on file   Highest education  level: 12th grade  Occupational History   Not on file  Tobacco Use   Smoking status: Never   Smokeless tobacco: Never  Vaping Use   Vaping status: Never Used  Substance and Sexual Activity   Alcohol use: Yes    Alcohol/week: 7.0 standard drinks of alcohol    Types: 7 Glasses of wine per week   Drug use: Never   Sexual activity: Yes  Other Topics Concern   Not on file  Social History Narrative   Not on file   Social Drivers of Health   Financial Resource Strain: Low Risk  (05/25/2023)   Overall Financial Resource Strain (CARDIA)    Difficulty of Paying Living Expenses: Not hard at all  Food Insecurity: No Food Insecurity (05/25/2023)   Hunger Vital Sign    Worried About Running Out of Food in the Last Year: Never true    Ran Out of Food in the Last Year: Never true  Transportation Needs: No Transportation Needs (05/25/2023)   PRAPARE - Administrator, Civil Service (Medical): No    Lack of Transportation (Non-Medical): No  Physical Activity: Insufficiently Active (05/25/2023)   Exercise Vital Sign    Days of Exercise per Week: 2 days    Minutes of Exercise per Session: 30 min  Stress: No Stress Concern Present (05/25/2023)   [employer] of Occupational Health - Occupational Stress Questionnaire    Feeling of Stress : Not at all  Social Connections: Socially Integrated (05/25/2023)   Social Connection and Isolation Panel [NHANES]    Frequency of Communication with Friends and Family: Twice a week    Frequency of Social Gatherings with Friends and Family: Three times a week    Attends Religious Services: More than 4 times per year    Active Member of Clubs or Organizations: Yes    Attends Banker Meetings: More than 4 times per year    Marital Status: Married  Catering manager Violence: Not At Risk (07/20/2023)   Humiliation, Afraid, Rape, and Kick questionnaire    Fear of  Current or Ex-Partner: No    Emotionally Abused: No    Physically Abused: No    Sexually Abused: No   Social History   Tobacco Use  Smoking Status Never  Smokeless Tobacco Never   Social History   Substance and Sexual Activity  Alcohol Use Yes   Alcohol/week: 7.0 standard drinks of alcohol   Types: 7 Glasses of wine per week    Family History:  Family History  Problem Relation Age of Onset   Cancer Mother        breast   Osteoporosis Mother    Breast cancer Mother    Heart disease Father    Hypertension Father    Hyperlipidemia Father    Diabetes Sister    Hyperlipidemia Sister    Hyperlipidemia Brother    Diabetes Paternal Grandmother    Past medical history, surgical history, medications, allergies, family history and social history reviewed with patient today and changes made to appropriate areas of the chart.   ROS All other ROS negative except what is listed above and in the HPI.      Objective:    BP (!) 142/85 (BP Location: Left Arm, Patient Position: Sitting, Cuff Size: Large)   Pulse 67   Ht 5\' 3"  (1.6 m)   Wt 150 lb 9.6 oz (68.3 kg)   LMP  (LMP Unknown)   SpO2 96%   BMI 26.68 kg/m  Wt Readings from Last 3 Encounters:  07/20/23 150 lb 9.6 oz (68.3 kg)  05/29/23 154 lb 3.2 oz (69.9 kg)  04/06/23 158 lb 6.4 oz (71.8 kg)    Physical Exam Vitals and nursing note reviewed. Exam conducted with a chaperone present.  Constitutional:      General: She is awake. She is not in acute distress.    Appearance: She is well-developed and well-groomed. She is not ill-appearing or toxic-appearing.  HENT:     Head: Normocephalic and atraumatic.     Right Ear: Hearing, tympanic membrane, ear canal and external ear normal. No drainage.     Left Ear: Hearing, tympanic membrane, ear canal and external ear normal. No drainage.     Nose: Nose normal.     Right Sinus: No maxillary sinus tenderness or frontal sinus tenderness.     Left Sinus: No maxillary sinus  tenderness or frontal sinus tenderness.     Mouth/Throat:     Mouth: Mucous membranes are moist.     Pharynx: Oropharynx is clear. Uvula midline. No pharyngeal swelling, oropharyngeal exudate or posterior oropharyngeal erythema.  Eyes:     General: Lids are normal.        Right eye: No discharge.        Left eye: No discharge.     Extraocular Movements: Extraocular movements intact.     Conjunctiva/sclera: Conjunctivae normal.     Pupils: Pupils are equal, round, and reactive to light.     Visual Fields: Right eye visual fields normal and left eye visual fields normal.  Neck:     Thyroid: No thyromegaly.     Vascular: No carotid bruit.     Trachea: Trachea normal.  Cardiovascular:     Rate and Rhythm: Normal rate and regular rhythm.     Heart sounds: Normal heart sounds. No murmur heard.    No gallop.  Pulmonary:     Effort: Pulmonary effort is normal. No accessory muscle usage or respiratory distress.     Breath sounds: Normal breath sounds.  Chest:  Breasts:    Right: Normal.     Left: Normal.  Abdominal:     General: Bowel sounds are normal.     Palpations: Abdomen is soft. There is no hepatomegaly or splenomegaly.     Tenderness: There is no abdominal tenderness.  Musculoskeletal:        General: Normal range of motion.     Cervical back: Normal range of motion and neck supple.     Right lower leg: No edema.     Left lower leg: No edema.  Lymphadenopathy:     Head:     Right side of head: No submental, submandibular, tonsillar, preauricular or posterior auricular adenopathy.     Left side of head: No submental, submandibular, tonsillar, preauricular or posterior auricular adenopathy.     Cervical: No cervical adenopathy.     Upper Body:     Right upper body: No supraclavicular, axillary or pectoral adenopathy.     Left upper body: No supraclavicular, axillary or pectoral adenopathy.  Skin:    General: Skin is warm and dry.     Capillary Refill: Capillary refill takes  less than 2 seconds.     Findings: No rash.  Neurological:     Mental Status: She is alert and oriented to person, place, and time.     Gait: Gait is intact.     Deep Tendon Reflexes: Reflexes are normal and symmetric.  Reflex Scores:      Brachioradialis reflexes are 2+ on the right side and 2+ on the left side.      Patellar reflexes are 2+ on the right side and 2+ on the left side. Psychiatric:        Attention and Perception: Attention normal.        Mood and Affect: Mood normal.        Speech: Speech normal.        Behavior: Behavior normal. Behavior is cooperative.        Thought Content: Thought content normal.        Judgment: Judgment normal.       07/20/2023   10:49 AM 07/13/2022    4:42 PM  6CIT Screen  What Year? 0 points 0 points  What month? 0 points 0 points  What time? 0 points 0 points  Count back from 20 0 points 0 points  Months in reverse 0 points 0 points  Repeat phrase 0 points 0 points  Total Score 0 points 0 points   Results for orders placed or performed in visit on 01/12/23  Comprehensive metabolic panel   Collection Time: 01/12/23 10:36 AM  Result Value Ref Range   Glucose 98 70 - 99 mg/dL   BUN 15 8 - 27 mg/dL   Creatinine, Ser 1.61 0.57 - 1.00 mg/dL   eGFR 88 >09 UE/AVW/0.98   BUN/Creatinine Ratio 21 12 - 28   Sodium 141 134 - 144 mmol/L   Potassium 4.5 3.5 - 5.2 mmol/L   Chloride 102 96 - 106 mmol/L   CO2 25 20 - 29 mmol/L   Calcium 9.9 8.7 - 10.3 mg/dL   Total Protein 6.8 6.0 - 8.5 g/dL   Albumin 4.4 3.8 - 4.8 g/dL   Globulin, Total 2.4 1.5 - 4.5 g/dL   Bilirubin Total 0.2 0.0 - 1.2 mg/dL   Alkaline Phosphatase 85 44 - 121 IU/L   AST 14 0 - 40 IU/L   ALT 20 0 - 32 IU/L      Assessment & Plan:   Problem List Items Addressed This Visit       Cardiovascular and Mediastinum   Essential hypertension   Relevant Medications   amLODipine (NORVASC) 5 MG tablet   Other Relevant Orders   Microalbumin, Urine Waived   Comprehensive  metabolic panel   CBC with Differential/Platelet     Endocrine   Thyroid nodule   Relevant Orders   T4, free   TSH     Musculoskeletal and Integument   Osteopenia after menopause   Relevant Orders   DG Bone Density   VITAMIN D 25 Hydroxy (Vit-D Deficiency, Fractures)     Other   Elevated LDL cholesterol level   Relevant Orders   Lipid Panel w/o Chol/HDL Ratio   Comprehensive metabolic panel   Other Visit Diagnoses       Medicare annual wellness visit, subsequent    -  Primary   Medicare wellness due and performed with patient today.     Encounter for annual physical exam       Annual physical today with labs and health maintenance reviewed, discussed with patient.        Follow up plan: Return in about 5 weeks (around 08/24/2023) for HTN -- added Amlodipine, plus needs lab only visit in morning.   LABORATORY TESTING:  - Pap smear: not applicable  IMMUNIZATIONS:   - Tdap: Tetanus vaccination status reviewed: last tetanus booster within 10  years. - Influenza: Up to date - Pneumovax: Up to date - Prevnar: Up to date - COVID: Up to date - HPV: Not applicable - Shingrix vaccine: Up to date  SCREENING: -Mammogram: Up to date - ordered for September - Colonoscopy:  wishes to think about this - Bone Density: Up to date -- ordered for September -Hearing Test: Not applicable  -Spirometry: Not applicable   PATIENT COUNSELING:   Advised to take 1 mg of folate supplement per day if capable of pregnancy.   Sexuality: Discussed sexually transmitted diseases, partner selection, use of condoms, avoidance of unintended pregnancy  and contraceptive alternatives.   Advised to avoid cigarette smoking.  I discussed with the patient that most people either abstain from alcohol or drink within safe limits (<=14/week and <=4 drinks/occasion for males, <=7/weeks and <= 3 drinks/occasion for females) and that the risk for alcohol disorders and other health effects rises  proportionally with the number of drinks per week and how often a drinker exceeds daily limits.  Discussed cessation/primary prevention of drug use and availability of treatment for abuse.   Diet: Encouraged to adjust caloric intake to maintain  or achieve ideal body weight, to reduce intake of dietary saturated fat and total fat, to limit sodium intake by avoiding high sodium foods and not adding table salt, and to maintain adequate dietary potassium and calcium preferably from fresh fruits, vegetables, and low-fat dairy products.    Stressed the importance of regular exercise  Injury prevention: Discussed safety belts, safety helmets, smoke detector, smoking near bedding or upholstery.   Dental health: Discussed importance of regular tooth brushing, flossing, and dental visits.    NEXT PREVENTATIVE PHYSICAL DUE IN 1 YEAR. Return in about 5 weeks (around 08/24/2023) for HTN -- added Amlodipine, plus needs lab only visit in morning.

## 2023-07-20 NOTE — Assessment & Plan Note (Addendum)
 Chronic, ongoing.  BP continues to have elevations on SBP, tolerating Losartan but not HCTZ. Will continue Losartan 100 MG daily and she wishes to re trial Amlodipine 5 MG as this did work well for SBP.  Educated her on this medication and side effects to monitor for and alert PCP of.  Recommend she monitor BP at least a few mornings a week at home and document.  DASH diet at home.  Labs today: CBC, CMP, TSH, urine ALB.  ?some ISH present.   - Would avoid BB due to lower baseline HR (60's) - Could consider low dose Clonidine or Hydralazine if needed in future.

## 2023-07-20 NOTE — Assessment & Plan Note (Signed)
 Chronic, ongoing.  Does not wish to take statin, have discussed with her at length.  Continue supplements and monitor labs.  Lipid panel in morning with fasting.  Discussed option of calcium scoring to further assess risk.

## 2023-07-20 NOTE — Assessment & Plan Note (Signed)
 Ongoing issue, continue visits with Dr. Yves Dill with physiatry, is offering benefit.

## 2023-07-20 NOTE — Assessment & Plan Note (Signed)
 Noted on imaging 08/25/21, biopsy performed 11/04/21 with insufficient sample.  Continue collaboration with ENT, she is to have ultrasound every year for 5 years.  Thyroid in morning.

## 2023-07-20 NOTE — Assessment & Plan Note (Signed)
 Chronic.  Continue supplements, check Vit D level in morning.  Next DEXA due in 2025 -- will get with mammogram in the fall.

## 2023-07-21 ENCOUNTER — Other Ambulatory Visit

## 2023-07-21 DIAGNOSIS — I10 Essential (primary) hypertension: Secondary | ICD-10-CM | POA: Diagnosis not present

## 2023-07-21 DIAGNOSIS — M8580 Other specified disorders of bone density and structure, unspecified site: Secondary | ICD-10-CM | POA: Diagnosis not present

## 2023-07-21 DIAGNOSIS — E041 Nontoxic single thyroid nodule: Secondary | ICD-10-CM | POA: Diagnosis not present

## 2023-07-21 DIAGNOSIS — Z780 Asymptomatic menopausal state: Secondary | ICD-10-CM | POA: Diagnosis not present

## 2023-07-21 DIAGNOSIS — E7800 Pure hypercholesterolemia, unspecified: Secondary | ICD-10-CM | POA: Diagnosis not present

## 2023-07-21 LAB — MICROALBUMIN, URINE WAIVED
Creatinine, Urine Waived: 200 mg/dL (ref 10–300)
Microalb, Ur Waived: 80 mg/L — ABNORMAL HIGH (ref 0–19)
Microalb/Creat Ratio: <30 mg/g

## 2023-07-22 ENCOUNTER — Encounter: Payer: Self-pay | Admitting: Nurse Practitioner

## 2023-07-22 LAB — COMPREHENSIVE METABOLIC PANEL
ALT: 14 IU/L (ref 0–32)
AST: 14 IU/L (ref 0–40)
Albumin: 4.3 g/dL (ref 3.8–4.8)
Alkaline Phosphatase: 80 IU/L (ref 44–121)
BUN/Creatinine Ratio: 27 (ref 12–28)
BUN: 19 mg/dL (ref 8–27)
Bilirubin Total: 0.3 mg/dL (ref 0.0–1.2)
CO2: 27 mmol/L (ref 20–29)
Calcium: 9.7 mg/dL (ref 8.7–10.3)
Chloride: 102 mmol/L (ref 96–106)
Creatinine, Ser: 0.71 mg/dL (ref 0.57–1.00)
Globulin, Total: 2.5 g/dL (ref 1.5–4.5)
Glucose: 99 mg/dL (ref 70–99)
Potassium: 4.2 mmol/L (ref 3.5–5.2)
Sodium: 142 mmol/L (ref 134–144)
Total Protein: 6.8 g/dL (ref 6.0–8.5)
eGFR: 89 mL/min/{1.73_m2} (ref >59)

## 2023-07-22 LAB — LIPID PANEL W/O CHOL/HDL RATIO
Cholesterol, Total: 240 mg/dL — ABNORMAL HIGH (ref 100–199)
HDL: 70 mg/dL (ref >39)
LDL Chol Calc (NIH): 149 mg/dL — ABNORMAL HIGH (ref 0–99)
Triglycerides: 119 mg/dL (ref 0–149)
VLDL Cholesterol Cal: 21 mg/dL (ref 5–40)

## 2023-07-22 LAB — CBC WITH DIFFERENTIAL/PLATELET
Basophils Absolute: 0.1 10*3/uL (ref 0.0–0.2)
Basos: 1 %
EOS (ABSOLUTE): 0.2 10*3/uL (ref 0.0–0.4)
Eos: 4 %
Hematocrit: 44.8 % (ref 34.0–46.6)
Hemoglobin: 14.7 g/dL (ref 11.1–15.9)
Immature Grans (Abs): 0 10*3/uL (ref 0.0–0.1)
Immature Granulocytes: 0 %
Lymphocytes Absolute: 1.5 10*3/uL (ref 0.7–3.1)
Lymphs: 27 %
MCH: 30.9 pg (ref 26.6–33.0)
MCHC: 32.8 g/dL (ref 31.5–35.7)
MCV: 94 fL (ref 79–97)
Monocytes Absolute: 0.4 10*3/uL (ref 0.1–0.9)
Monocytes: 7 %
Neutrophils Absolute: 3.3 10*3/uL (ref 1.4–7.0)
Neutrophils: 61 %
Platelets: 238 10*3/uL (ref 150–450)
RBC: 4.76 x10E6/uL (ref 3.77–5.28)
RDW: 12.5 % (ref 11.7–15.4)
WBC: 5.5 10*3/uL (ref 3.4–10.8)

## 2023-07-22 LAB — TSH: TSH: 2.79 u[IU]/mL (ref 0.450–4.500)

## 2023-07-22 LAB — VITAMIN D 25 HYDROXY (VIT D DEFICIENCY, FRACTURES): Vit D, 25-Hydroxy: 45.7 ng/mL (ref 30.0–100.0)

## 2023-07-22 LAB — T4, FREE: Free T4: 1.37 ng/dL (ref 0.82–1.77)

## 2023-07-22 NOTE — Progress Notes (Signed)
 Contacted via MyChart   Good evening Laura Gibson, your labs have returned and are overall stable with exception of lipid panel still showing elevations, however there is a trend down this check.  Continue all current medications and let me know if any questions? Keep being stellar!!  Thank you for allowing me to participate in your care.  I appreciate you. Kindest regards, Daneisha Surges

## 2023-08-13 NOTE — Patient Instructions (Signed)

## 2023-08-24 ENCOUNTER — Encounter: Payer: Self-pay | Admitting: Nurse Practitioner

## 2023-08-24 ENCOUNTER — Ambulatory Visit (INDEPENDENT_AMBULATORY_CARE_PROVIDER_SITE_OTHER): Admitting: Nurse Practitioner

## 2023-08-24 VITALS — BP 132/72 | HR 65 | Temp 98.6°F | Ht 62.8 in | Wt 151.2 lb

## 2023-08-24 DIAGNOSIS — I10 Essential (primary) hypertension: Secondary | ICD-10-CM | POA: Diagnosis not present

## 2023-08-24 MED ORDER — AMLODIPINE BESYLATE 5 MG PO TABS
5.0000 mg | ORAL_TABLET | Freq: Every day | ORAL | 1 refills | Status: DC
Start: 2023-08-24 — End: 2024-02-22

## 2023-08-24 NOTE — Assessment & Plan Note (Signed)
 Chronic, improving with Amlodipine 5 MG and she is tolerating on this trial.  Continue Losartan 100 MG daily and Amlodipine 5 MG daily.  Recommend she change positions slowly and wear compression socks as needed to prevent orthostatic hypotension.  Educated her on medications and side effects to monitor for and alert PCP of.  Recommend she monitor BP at least a few mornings a week at home and document.  DASH diet at home.  Labs today: up to date.  ?some ISH present.   - Would avoid BB due to lower baseline HR (60's) - Could consider low dose Clonidine or Hydralazine if needed in future.

## 2023-08-24 NOTE — Progress Notes (Signed)
 BP 132/72 (BP Location: Left Arm, Patient Position: Sitting, Cuff Size: Normal)   Pulse 65   Temp 98.6 F (37 C) (Oral)   Ht 5' 2.8" (1.595 m)   Wt 151 lb 3.2 oz (68.6 kg)   LMP  (LMP Unknown)   SpO2 98%   BMI 26.95 kg/m    Subjective:    Patient ID: Laura Gibson, female    DOB: 1951-02-06, 73 y.o.   MRN: [id number]  HPI: Laura Gibson is a 73 y.o. female  Chief Complaint  Patient presents with   Hypertension   HYPERTENSION without Chronic Kidney Disease Started Amlodipine last visit, tolerating well on this trial. Continues Losartan with tolerance. Hypertension status: stable  Satisfied with current treatment? yes Duration of hypertension: chronic BP monitoring frequency:  a few times a week BP range: 3/28 137/72, 3/31 129/69, 4/3 139/75, 4/8 134/77 BP medication side effects:  no Medication compliance: good compliance Previous BP meds: Aspirin: no Recurrent headaches: no Visual changes: no Palpitations: no Dyspnea: no Chest pain: no Lower extremity edema: no Dizzy/lightheaded: when gets up in the morning  Relevant past medical, surgical, family and social history reviewed and updated as indicated. Interim medical history since our last visit reviewed. Allergies and medications reviewed and updated.  Review of Systems  Constitutional:  Negative for activity change, appetite change, diaphoresis, fatigue and fever.  Respiratory:  Negative for cough, chest tightness and shortness of breath.   Cardiovascular:  Negative for chest pain, palpitations and leg swelling.  Neurological: Negative.   Psychiatric/Behavioral: Negative.      Per HPI unless specifically indicated above     Objective:    BP 132/72 (BP Location: Left Arm, Patient Position: Sitting, Cuff Size: Normal)   Pulse 65   Temp 98.6 F (37 C) (Oral)   Ht 5' 2.8" (1.595 m)   Wt 151 lb 3.2 oz (68.6 kg)   LMP  (LMP Unknown)   SpO2 98%   BMI 26.95 kg/m   Wt Readings from Last 3 Encounters:   08/24/23 151 lb 3.2 oz (68.6 kg)  07/20/23 150 lb 9.6 oz (68.3 kg)  05/29/23 154 lb 3.2 oz (69.9 kg)    Physical Exam Vitals and nursing note reviewed.  Constitutional:      General: She is awake. She is not in acute distress.    Appearance: She is well-developed and well-groomed. She is not ill-appearing or toxic-appearing.  HENT:     Head: Normocephalic.     Right Ear: Hearing normal.     Left Ear: Hearing normal.  Eyes:     General: Lids are normal.        Right eye: No discharge.        Left eye: No discharge.     Conjunctiva/sclera: Conjunctivae normal.     Pupils: Pupils are equal, round, and reactive to light.  Neck:     Thyroid: No thyromegaly.     Vascular: No carotid bruit.  Cardiovascular:     Rate and Rhythm: Normal rate and regular rhythm.     Heart sounds: Normal heart sounds. No murmur heard.    No gallop.  Pulmonary:     Effort: Pulmonary effort is normal. No accessory muscle usage or respiratory distress.     Breath sounds: Normal breath sounds.  Abdominal:     General: Bowel sounds are normal.     Palpations: Abdomen is soft. There is no hepatomegaly or splenomegaly.  Musculoskeletal:     Cervical back:  Normal range of motion and neck supple.     Right lower leg: No edema.     Left lower leg: No edema.  Lymphadenopathy:     Cervical: No cervical adenopathy.  Skin:    General: Skin is warm and dry.  Neurological:     Mental Status: She is alert and oriented to person, place, and time.  Psychiatric:        Attention and Perception: Attention normal.        Mood and Affect: Mood normal.        Speech: Speech normal.        Behavior: Behavior normal. Behavior is cooperative.        Thought Content: Thought content normal.    Results for orders placed or performed in visit on 07/21/23  Microalbumin, Urine Waived   Collection Time: 07/21/23  8:45 AM  Result Value Ref Range   Microalb, Ur Waived 80 (H) 0 - 19 mg/L   Creatinine, Urine Waived 200 10 -  300 mg/dL   Microalb/Creat Ratio <30 <30 mg/g  CBC with Differential/Platelet   Collection Time: 07/21/23  8:46 AM  Result Value Ref Range   WBC 5.5 3.4 - 10.8 x10E3/uL   RBC 4.76 3.77 - 5.28 x10E6/uL   Hemoglobin 14.7 11.1 - 15.9 g/dL   Hematocrit 16.1 09.6 - 46.6 %   MCV 94 79 - 97 fL   MCH 30.9 26.6 - 33.0 pg   MCHC 32.8 31.5 - 35.7 g/dL   RDW 04.5 40.9 - 81.1 %   Platelets 238 150 - 450 x10E3/uL   Neutrophils 61 Not Estab. %   Lymphs 27 Not Estab. %   Monocytes 7 Not Estab. %   Eos 4 Not Estab. %   Basos 1 Not Estab. %   Neutrophils Absolute 3.3 1.4 - 7.0 x10E3/uL   Lymphocytes Absolute 1.5 0.7 - 3.1 x10E3/uL   Monocytes Absolute 0.4 0.1 - 0.9 x10E3/uL   EOS (ABSOLUTE) 0.2 0.0 - 0.4 x10E3/uL   Basophils Absolute 0.1 0.0 - 0.2 x10E3/uL   Immature Granulocytes 0 Not Estab. %   Immature Grans (Abs) 0.0 0.0 - 0.1 x10E3/uL  Comprehensive metabolic panel   Collection Time: 07/21/23  8:46 AM  Result Value Ref Range   Glucose 99 70 - 99 mg/dL   BUN 19 8 - 27 mg/dL   Creatinine, Ser 9.14 0.57 - 1.00 mg/dL   eGFR 89 >78 GN/FAO/1.30   BUN/Creatinine Ratio 27 12 - 28   Sodium 142 134 - 144 mmol/L   Potassium 4.2 3.5 - 5.2 mmol/L   Chloride 102 96 - 106 mmol/L   CO2 27 20 - 29 mmol/L   Calcium 9.7 8.7 - 10.3 mg/dL   Total Protein 6.8 6.0 - 8.5 g/dL   Albumin 4.3 3.8 - 4.8 g/dL   Globulin, Total 2.5 1.5 - 4.5 g/dL   Bilirubin Total 0.3 0.0 - 1.2 mg/dL   Alkaline Phosphatase 80 44 - 121 IU/L   AST 14 0 - 40 IU/L   ALT 14 0 - 32 IU/L  VITAMIN D 25 Hydroxy (Vit-D Deficiency, Fractures)   Collection Time: 07/21/23  8:46 AM  Result Value Ref Range   Vit D, 25-Hydroxy 45.7 30.0 - 100.0 ng/mL  TSH   Collection Time: 07/21/23  8:46 AM  Result Value Ref Range   TSH 2.790 0.450 - 4.500 uIU/mL  T4, free   Collection Time: 07/21/23  8:46 AM  Result Value Ref Range   Free  T4 1.37 0.82 - 1.77 ng/dL  Lipid Panel w/o Chol/HDL Ratio   Collection Time: 07/21/23  8:46 AM  Result Value  Ref Range   Cholesterol, Total 240 (H) 100 - 199 mg/dL   Triglycerides 161 0 - 149 mg/dL   HDL 70 >09 mg/dL   VLDL Cholesterol Cal 21 5 - 40 mg/dL   LDL Chol Calc (NIH) 604 (H) 0 - 99 mg/dL      Assessment & Plan:   Problem List Items Addressed This Visit       Cardiovascular and Mediastinum   Essential hypertension - Primary   Chronic, improving with Amlodipine 5 MG and she is tolerating on this trial.  Continue Losartan 100 MG daily and Amlodipine 5 MG daily.  Recommend she change positions slowly and wear compression socks as needed to prevent orthostatic hypotension.  Educated her on medications and side effects to monitor for and alert PCP of.  Recommend she monitor BP at least a few mornings a week at home and document.  DASH diet at home.  Labs today: up to date.  ?some ISH present.   - Would avoid BB due to lower baseline HR (60's) - Could consider low dose Clonidine or Hydralazine if needed in future.      Relevant Medications   amLODipine (NORVASC) 5 MG tablet     Follow up plan: Return in about 5 months (around 01/29/2024) for HTN/HLD.

## 2023-10-12 DIAGNOSIS — M47812 Spondylosis without myelopathy or radiculopathy, cervical region: Secondary | ICD-10-CM | POA: Diagnosis not present

## 2023-10-23 DIAGNOSIS — M9902 Segmental and somatic dysfunction of thoracic region: Secondary | ICD-10-CM | POA: Diagnosis not present

## 2023-10-23 DIAGNOSIS — M62830 Muscle spasm of back: Secondary | ICD-10-CM | POA: Diagnosis not present

## 2023-10-23 DIAGNOSIS — M542 Cervicalgia: Secondary | ICD-10-CM | POA: Diagnosis not present

## 2023-10-23 DIAGNOSIS — M9901 Segmental and somatic dysfunction of cervical region: Secondary | ICD-10-CM | POA: Diagnosis not present

## 2023-11-27 DIAGNOSIS — M47812 Spondylosis without myelopathy or radiculopathy, cervical region: Secondary | ICD-10-CM | POA: Diagnosis not present

## 2023-11-27 DIAGNOSIS — M62838 Other muscle spasm: Secondary | ICD-10-CM | POA: Diagnosis not present

## 2023-11-27 DIAGNOSIS — M4802 Spinal stenosis, cervical region: Secondary | ICD-10-CM | POA: Diagnosis not present

## 2023-11-27 DIAGNOSIS — M5412 Radiculopathy, cervical region: Secondary | ICD-10-CM | POA: Diagnosis not present

## 2023-12-21 DIAGNOSIS — Z1211 Encounter for screening for malignant neoplasm of colon: Secondary | ICD-10-CM | POA: Diagnosis not present

## 2023-12-21 DIAGNOSIS — Z1212 Encounter for screening for malignant neoplasm of rectum: Secondary | ICD-10-CM | POA: Diagnosis not present

## 2023-12-22 DIAGNOSIS — M9902 Segmental and somatic dysfunction of thoracic region: Secondary | ICD-10-CM | POA: Diagnosis not present

## 2023-12-22 DIAGNOSIS — M62830 Muscle spasm of back: Secondary | ICD-10-CM | POA: Diagnosis not present

## 2023-12-22 DIAGNOSIS — M542 Cervicalgia: Secondary | ICD-10-CM | POA: Diagnosis not present

## 2023-12-22 DIAGNOSIS — M9901 Segmental and somatic dysfunction of cervical region: Secondary | ICD-10-CM | POA: Diagnosis not present

## 2023-12-27 ENCOUNTER — Other Ambulatory Visit: Payer: Self-pay | Admitting: Nurse Practitioner

## 2023-12-27 DIAGNOSIS — Z1231 Encounter for screening mammogram for malignant neoplasm of breast: Secondary | ICD-10-CM

## 2023-12-28 LAB — COLOGUARD: COLOGUARD: NEGATIVE

## 2024-01-01 ENCOUNTER — Other Ambulatory Visit: Payer: Self-pay | Admitting: Otolaryngology

## 2024-01-01 DIAGNOSIS — E041 Nontoxic single thyroid nodule: Secondary | ICD-10-CM | POA: Diagnosis not present

## 2024-01-18 ENCOUNTER — Ambulatory Visit
Admission: RE | Admit: 2024-01-18 | Discharge: 2024-01-18 | Disposition: A | Discharge status: Home / Self Care | Source: Ambulatory Visit | Attending: Otolaryngology | Admitting: Otolaryngology

## 2024-01-18 DIAGNOSIS — E041 Nontoxic single thyroid nodule: Secondary | ICD-10-CM

## 2024-01-18 DIAGNOSIS — E042 Nontoxic multinodular goiter: Secondary | ICD-10-CM | POA: Diagnosis not present

## 2024-01-21 NOTE — Patient Instructions (Signed)
 Be Involved in Caring For Your Health:  Taking Medications When medications are taken as directed, they can greatly improve your health. But if they are not taken as prescribed, they may not work. In some cases, not taking them correctly can be harmful. To help ensure your treatment remains effective and safe, understand your medications and how to take them. Bring your medications to each visit for review by your provider.  Your lab results, notes, and after visit summary will be available on My Chart. We strongly encourage you to use this feature. If lab results are abnormal the clinic will contact you with the appropriate steps. If the clinic does not contact you assume the results are satisfactory. You can always view your results on My Chart. If you have questions regarding your health or results, please contact the clinic during office hours. You can also ask questions on My Chart.  We at [facility] are grateful that you chose us  to provide your care. We strive to provide evidence-based and compassionate care and are always looking for feedback. If you get a survey from the clinic please complete this so we can hear your opinions.  DASH Eating Plan DASH stands for Dietary Approaches to Stop Hypertension. The DASH eating plan is a healthy eating plan that has been shown to: Lower high blood pressure (hypertension). Reduce your risk for type 2 diabetes, heart disease, and stroke. Help with weight loss. What are tips for following this plan? Reading food labels Check food labels for the amount of salt (sodium) per serving. Choose foods with less than 5 percent of the Daily Value (DV) of sodium. In general, foods with less than 300 milligrams (mg) of sodium per serving fit into this eating plan. To find whole grains, look for the word whole as the first word in the ingredient list. Shopping Buy products labeled as low-sodium or no salt added. Buy fresh foods. Avoid canned  foods and pre-made or frozen meals. Cooking Try not to add salt when you cook. Use salt-free seasonings or herbs instead of table salt or sea salt. Check with your health care provider or pharmacist before using salt substitutes. Do not fry foods. Cook foods in healthy ways, such as baking, boiling, grilling, roasting, or broiling. Cook using oils that are good for your heart. These include olive, canola, avocado, soybean, and sunflower oil. Meal planning  Eat a balanced diet. This should include: 4 or more servings of fruits and 4 or more servings of vegetables each day. Try to fill half of your plate with fruits and vegetables. 6-8 servings of whole grains each day. 6 or less servings of lean meat, poultry, or fish each day. 1 oz is 1 serving. A 3 oz (85 g) serving of meat is about the same size as the palm of your hand. One egg is 1 oz (28 g). 2-3 servings of low-fat dairy each day. One serving is 1 cup (237 mL). 1 serving of nuts, seeds, or beans 5 times each week. 2-3 servings of heart-healthy fats. Healthy fats called omega-3 fatty acids are found in foods such as walnuts, flaxseeds, fortified milks, and eggs. These fats are also found in cold-water fish, such as sardines, salmon, and mackerel. Limit how much you eat of: Canned or prepackaged foods. Food that is high in trans fat, such as fried foods. Food that is high in saturated fat, such as fatty meat. Desserts and other sweets, sugary drinks, and other foods with added sugar. Full-fat  dairy products. Do not salt foods before eating. Do not eat more than 4 egg yolks a week. Try to eat at least 2 vegetarian meals a week. Eat more home-cooked food and less restaurant, buffet, and fast food. Lifestyle When eating at a restaurant, ask if your food can be made with less salt or no salt. If you drink alcohol: Limit how much you have to: 0-1 drink a day if you are female. 0-2 drinks a day if you are female. Know how much alcohol is in  your drink. In the [place] one drink is one 12 oz bottle of beer (355 mL), one 5 oz glass of wine (148 mL), or one 1 oz glass of hard liquor (44 mL). General information Avoid eating more than 2,300 mg of salt a day. If you have hypertension, you may need to reduce your sodium intake to 1,500 mg a day. Work with your provider to stay at a healthy body weight or lose weight. Ask what the best weight range is for you. On most days of the week, get at least 30 minutes of exercise that causes your heart to beat faster. This may include walking, swimming, or biking. Work with your provider or dietitian to adjust your eating plan to meet your specific calorie needs. What foods should I eat? Fruits All fresh, dried, or frozen fruit. Canned fruits that are in their natural juice and do not have sugar added to them. Vegetables Fresh or frozen vegetables that are raw, steamed, roasted, or grilled. Low-sodium or reduced-sodium tomato and vegetable juice. Low-sodium or reduced-sodium tomato sauce and tomato paste. Low-sodium or reduced-sodium canned vegetables. Grains Whole-grain or whole-wheat bread. Whole-grain or whole-wheat pasta. Brown rice. [place]. Bulgur. Whole-grain and low-sodium cereals. Pita bread. Low-fat, low-sodium crackers. Whole-wheat flour tortillas. Meats and other proteins Skinless chicken or [place]. Ground chicken or [place]. Pork with fat trimmed off. Fish and seafood. Egg whites. Dried beans, peas, or lentils. Unsalted nuts, nut butters, and seeds. Unsalted canned beans. Lean cuts of beef with fat trimmed off. Low-sodium, lean precooked or cured meat, such as sausages or meat loaves. Dairy Low-fat (1%) or fat-free (skim) milk. Reduced-fat, low-fat, or fat-free cheeses. Nonfat, low-sodium ricotta or cottage cheese. Low-fat or nonfat yogurt. Low-fat, low-sodium cheese. Fats and oils Soft margarine without trans fats. Vegetable oil. Reduced-fat, low-fat, or light mayonnaise and salad  dressings (reduced-sodium). Canola, safflower, olive, avocado, soybean, and sunflower oils. Avocado. Seasonings and condiments Herbs. Spices. Seasoning mixes without salt. Other foods Unsalted popcorn and pretzels. Fat-free sweets. The items listed above may not be all the foods and drinks you can have. Talk to a dietitian to learn more. What foods should I avoid? Fruits Canned fruit in a light or heavy syrup. Fried fruit. Fruit in cream or butter sauce. Vegetables Creamed or fried vegetables. Vegetables in a cheese sauce. Regular canned vegetables that are not marked as low-sodium or reduced-sodium. Regular canned tomato sauce and paste that are not marked as low-sodium or reduced-sodium. Regular tomato and vegetable juices that are not marked as low-sodium or reduced-sodium. Dene. Olives. Grains Baked goods made with fat, such as croissants, muffins, or some breads. Dry pasta or rice meal packs. Meats and other proteins Fatty cuts of meat. Ribs. Fried meat. [place]. Bologna, salami, and other precooked or cured meats, such as sausages or meat loaves, that are not lean and low in sodium. Fat from the back of a pig (fatback). Bratwurst. Salted nuts and seeds. Canned beans with added salt. Canned  or smoked fish. Whole eggs or egg yolks. Chicken or [place] with skin. Dairy Whole or 2% milk, cream, and half-and-half. Whole or full-fat cream cheese. Whole-fat or sweetened yogurt. Full-fat cheese. Nondairy creamers. Whipped toppings. Processed cheese and cheese spreads. Fats and oils Butter. Stick margarine. Lard. Shortening. Ghee. Bacon fat. Tropical oils, such as coconut, palm kernel, or palm oil. Seasonings and condiments Onion salt, garlic salt, seasoned salt, table salt, and sea salt. Worcestershire sauce. Tartar sauce. Barbecue sauce. Teriyaki sauce. Soy sauce, including reduced-sodium soy sauce. Steak sauce. Canned and packaged gravies. Fish sauce. Oyster sauce. Cocktail sauce. Store-bought  horseradish. Ketchup. Mustard. Meat flavorings and tenderizers. Bouillon cubes. Hot sauces. Pre-made or packaged marinades. Pre-made or packaged taco seasonings. Relishes. Regular salad dressings. Other foods Salted popcorn and pretzels. The items listed above may not be all the foods and drinks you should avoid. Talk to a dietitian to learn more. Where to find more information National Heart, Lung, and Blood Institute (NHLBI): BuffaloDryCleaner.gl American Heart Association (AHA): heart.org Academy of Nutrition and Dietetics: eatright.org National Kidney Foundation (NKF): kidney.org This information is not intended to replace advice given to you by your health care provider. Make sure you discuss any questions you have with your health care provider. Document Revised: 05/19/2022 Document Reviewed: 05/19/2022 Elsevier Patient Education  2024 [employer].

## 2024-01-22 ENCOUNTER — Other Ambulatory Visit: Payer: Self-pay | Admitting: Otolaryngology

## 2024-01-22 DIAGNOSIS — E042 Nontoxic multinodular goiter: Secondary | ICD-10-CM

## 2024-01-25 ENCOUNTER — Encounter: Payer: Self-pay | Admitting: Nurse Practitioner

## 2024-01-25 ENCOUNTER — Ambulatory Visit (INDEPENDENT_AMBULATORY_CARE_PROVIDER_SITE_OTHER): Admitting: Nurse Practitioner

## 2024-01-25 VITALS — BP 148/81 | HR 62 | Temp 98.7°F | Resp 15 | Ht 62.8 in | Wt 149.2 lb

## 2024-01-25 DIAGNOSIS — E041 Nontoxic single thyroid nodule: Secondary | ICD-10-CM

## 2024-01-25 DIAGNOSIS — E7800 Pure hypercholesterolemia, unspecified: Secondary | ICD-10-CM

## 2024-01-25 DIAGNOSIS — I10 Essential (primary) hypertension: Secondary | ICD-10-CM

## 2024-01-25 DIAGNOSIS — Z23 Encounter for immunization: Secondary | ICD-10-CM

## 2024-01-25 MED ORDER — COVID-19 MRNA VAC-TRIS(PFIZER) 30 MCG/0.3ML IM SUSY: 0.3000 mL | PREFILLED_SYRINGE | Freq: Once | INTRAMUSCULAR | 0 refills | Status: AC

## 2024-01-25 NOTE — Assessment & Plan Note (Signed)
 Chronic, ongoing.  Does not wish to take statin, have discussed with her at length.  Continue supplements and monitor labs.  Lipid panel next visit.  Discussed option of calcium scoring to further assess risk.

## 2024-01-25 NOTE — Assessment & Plan Note (Signed)
 Noted on imaging 08/25/21, recent imaging 01/18/24.  Continue collaboration with ENT, she is to have ultrasound every year for 5 years.  Thyroid  labs next visit.

## 2024-01-25 NOTE — Progress Notes (Signed)
 BP (!) 148/81 (BP Location: Left Arm, Patient Position: Sitting, Cuff Size: Normal)   Pulse 62   Temp 98.7 F (37.1 C) (Oral)   Resp 15   Ht 5' 2.8 (1.595 m)   Wt 149 lb 3.2 oz (67.7 kg)   LMP  (LMP Unknown)   SpO2 98%   BMI 26.60 kg/m    Subjective:    Patient ID: Laura Gibson, female    DOB: 1951/02/23, 73 y.o.   MRN: [id number]  HPI: Laura Gibson is a 73 y.o. female  Chief Complaint  Patient presents with   Hypertension    Home checks and still feels her numbers are all over the place. Average 140/60-162/76.   Hyperlipidemia    Dr. Patience last seen 01/01/2024.    HYPERTENSION / HYPERLIPIDEMIA Taking Amlodipine  5 MG and Losartan  100 MG daily. Reports continuing to hear pulsing in ears when she wakes, this goes away during the day. BP does fluctuate at home.  Follows with ENT for thyroid  nodules.  To have ultrasound yearly for 5 years, last on 01/18/24.  One nodule was recommended for biopsy, but will wait until next year. Satisfied with current treatment? yes Duration of hypertension: chronic BP monitoring frequency: a few times a month BP range: 140/60 to 162/76 BP medication side effects: no Duration of hyperlipidemia: chronic Cholesterol supplements: fish oil and red yeast rice Medication compliance: good compliance Aspirin: no Recent stressors: no Recurrent headaches: no Visual changes: no Palpitations: no Dyspnea: no Chest pain: no Lower extremity edema: no Dizzy/lightheaded: occasionally in the morning first thing  Relevant past medical, surgical, family and social history reviewed and updated as indicated. Interim medical history since our last visit reviewed. Allergies and medications reviewed and updated.  Review of Systems  Constitutional:  Negative for activity change, appetite change, diaphoresis, fatigue and fever.  Respiratory:  Negative for cough, chest tightness and shortness of breath.   Cardiovascular:  Negative for chest pain,  palpitations and leg swelling.  Neurological: Negative.   Psychiatric/Behavioral: Negative.      Per HPI unless specifically indicated above     Objective:    BP (!) 148/81 (BP Location: Left Arm, Patient Position: Sitting, Cuff Size: Normal)   Pulse 62   Temp 98.7 F (37.1 C) (Oral)   Resp 15   Ht 5' 2.8 (1.595 m)   Wt 149 lb 3.2 oz (67.7 kg)   LMP  (LMP Unknown)   SpO2 98%   BMI 26.60 kg/m   Wt Readings from Last 3 Encounters:  01/25/24 149 lb 3.2 oz (67.7 kg)  08/24/23 151 lb 3.2 oz (68.6 kg)  07/20/23 150 lb 9.6 oz (68.3 kg)    Physical Exam Vitals and nursing note reviewed.  Constitutional:      General: She is awake. She is not in acute distress.    Appearance: She is well-developed and well-groomed. She is not ill-appearing or toxic-appearing.  HENT:     Head: Normocephalic.     Right Ear: Hearing normal.     Left Ear: Hearing normal.  Eyes:     General: Lids are normal.        Right eye: No discharge.        Left eye: No discharge.     Conjunctiva/sclera: Conjunctivae normal.     Pupils: Pupils are equal, round, and reactive to light.  Neck:     Thyroid : Thyromegaly present.     Vascular: No carotid bruit.  Cardiovascular:  Rate and Rhythm: Normal rate and regular rhythm.     Heart sounds: Normal heart sounds. No murmur heard.    No gallop.  Pulmonary:     Effort: Pulmonary effort is normal. No accessory muscle usage or respiratory distress.     Breath sounds: Normal breath sounds.  Abdominal:     General: Bowel sounds are normal.     Palpations: Abdomen is soft. There is no hepatomegaly or splenomegaly.  Musculoskeletal:     Cervical back: Normal range of motion and neck supple.     Right lower leg: No edema.     Left lower leg: No edema.  Lymphadenopathy:     Cervical: No cervical adenopathy.  Skin:    General: Skin is warm and dry.  Neurological:     Mental Status: She is alert and oriented to person, place, and time.  Psychiatric:         Attention and Perception: Attention normal.        Mood and Affect: Mood normal.        Speech: Speech normal.        Behavior: Behavior normal. Behavior is cooperative.        Thought Content: Thought content normal.     Results for orders placed or performed in visit on 07/21/23  Microalbumin, Urine Waived   Collection Time: 07/21/23  8:45 AM  Result Value Ref Range   Microalb, Ur Waived 80 (H) 0 - 19 mg/L   Creatinine, Urine Waived 200 10 - 300 mg/dL   Microalb/Creat Ratio <30 <30 mg/g  CBC with Differential/Platelet   Collection Time: 07/21/23  8:46 AM  Result Value Ref Range   WBC 5.5 3.4 - 10.8 x10E3/uL   RBC 4.76 3.77 - 5.28 x10E6/uL   Hemoglobin 14.7 11.1 - 15.9 g/dL   Hematocrit 55.1 65.9 - 46.6 %   MCV 94 79 - 97 fL   MCH 30.9 26.6 - 33.0 pg   MCHC 32.8 31.5 - 35.7 g/dL   RDW 87.4 88.2 - 84.5 %   Platelets 238 150 - 450 x10E3/uL   Neutrophils 61 Not Estab. %   Lymphs 27 Not Estab. %   Monocytes 7 Not Estab. %   Eos 4 Not Estab. %   Basos 1 Not Estab. %   Neutrophils Absolute 3.3 1.4 - 7.0 x10E3/uL   Lymphocytes Absolute 1.5 0.7 - 3.1 x10E3/uL   Monocytes Absolute 0.4 0.1 - 0.9 x10E3/uL   EOS (ABSOLUTE) 0.2 0.0 - 0.4 x10E3/uL   Basophils Absolute 0.1 0.0 - 0.2 x10E3/uL   Immature Granulocytes 0 Not Estab. %   Immature Grans (Abs) 0.0 0.0 - 0.1 x10E3/uL  Comprehensive metabolic panel   Collection Time: 07/21/23  8:46 AM  Result Value Ref Range   Glucose 99 70 - 99 mg/dL   BUN 19 8 - 27 mg/dL   Creatinine, Ser 9.28 0.57 - 1.00 mg/dL   eGFR 89 >40 fO/fpw/8.26   BUN/Creatinine Ratio 27 12 - 28   Sodium 142 134 - 144 mmol/L   Potassium 4.2 3.5 - 5.2 mmol/L   Chloride 102 96 - 106 mmol/L   CO2 27 20 - 29 mmol/L   Calcium 9.7 8.7 - 10.3 mg/dL   Total Protein 6.8 6.0 - 8.5 g/dL   Albumin 4.3 3.8 - 4.8 g/dL   Globulin, Total 2.5 1.5 - 4.5 g/dL   Bilirubin Total 0.3 0.0 - 1.2 mg/dL   Alkaline Phosphatase 80 44 - 121 IU/L   AST  14 0 - 40 IU/L   ALT 14 0 - 32  IU/L  VITAMIN D  25 Hydroxy (Vit-D Deficiency, Fractures)   Collection Time: 07/21/23  8:46 AM  Result Value Ref Range   Vit D, 25-Hydroxy 45.7 30.0 - 100.0 ng/mL  TSH   Collection Time: 07/21/23  8:46 AM  Result Value Ref Range   TSH 2.790 0.450 - 4.500 uIU/mL  T4, free   Collection Time: 07/21/23  8:46 AM  Result Value Ref Range   Free T4 1.37 0.82 - 1.77 ng/dL  Lipid Panel w/o Chol/HDL Ratio   Collection Time: 07/21/23  8:46 AM  Result Value Ref Range   Cholesterol, Total 240 (H) 100 - 199 mg/dL   Triglycerides 880 0 - 149 mg/dL   HDL 70 >60 mg/dL   VLDL Cholesterol Cal 21 5 - 40 mg/dL   LDL Chol Calc (NIH) 850 (H) 0 - 99 mg/dL      Assessment & Plan:   Problem List Items Addressed This Visit       Cardiovascular and Mediastinum   Essential hypertension - Primary   Chronic, with some fluctuations and white coat syndrome.  Continue Losartan  100 MG daily and will trial increasing Amlodipine  to 10 MG daily (she will double her 5 MG over the next 4 weeks and alert PCP if any issues, if tolerates will send in a 10 MG tablet).  Recommend she change positions slowly and wear compression socks as needed to prevent orthostatic hypotension.  Educated her on medications and side effects to monitor for and alert PCP of.  Recommend she monitor BP at least a few mornings a week at home and document.  DASH diet at home.  Labs today: up to date.  ?some ISH present.   - Would avoid BB due to lower baseline HR (60's) - Could consider low dose Clonidine or Hydralazine if needed in future.        Endocrine   Thyroid  nodule   Noted on imaging 08/25/21, recent imaging 01/18/24.  Continue collaboration with ENT, she is to have ultrasound every year for 5 years.  Thyroid  labs next visit.        Other   Elevated LDL cholesterol level   Chronic, ongoing.  Does not wish to take statin, have discussed with her at length.  Continue supplements and monitor labs.  Lipid panel next visit.  Discussed  option of calcium scoring to further assess risk.      Other Visit Diagnoses       COVID-19 vaccine administered       Script sent to [facility]   Relevant Medications   COVID-19 mRNA vaccine, Pfizer, (COMIRNATY) syringe        Follow up plan: Return for as scheduled in March for physical.

## 2024-01-25 NOTE — Assessment & Plan Note (Signed)
 Chronic, with some fluctuations and white coat syndrome.  Continue Losartan  100 MG daily and will trial increasing Amlodipine  to 10 MG daily (she will double her 5 MG over the next 4 weeks and alert PCP if any issues, if tolerates will send in a 10 MG tablet).  Recommend she change positions slowly and wear compression socks as needed to prevent orthostatic hypotension.  Educated her on medications and side effects to monitor for and alert PCP of.  Recommend she monitor BP at least a few mornings a week at home and document.  DASH diet at home.  Labs today: up to date.  ?some ISH present.   - Would avoid BB due to lower baseline HR (60's) - Could consider low dose Clonidine or Hydralazine if needed in future.

## 2024-02-08 ENCOUNTER — Ambulatory Visit
Admission: RE | Admit: 2024-02-08 | Discharge: 2024-02-08 | Disposition: A | Discharge status: Home / Self Care | Source: Ambulatory Visit | Attending: Nurse Practitioner | Admitting: Nurse Practitioner

## 2024-02-08 ENCOUNTER — Ambulatory Visit: Payer: Self-pay | Admitting: Nurse Practitioner

## 2024-02-08 DIAGNOSIS — Z780 Asymptomatic menopausal state: Secondary | ICD-10-CM | POA: Diagnosis not present

## 2024-02-08 DIAGNOSIS — M8580 Other specified disorders of bone density and structure, unspecified site: Secondary | ICD-10-CM

## 2024-02-08 DIAGNOSIS — Z1231 Encounter for screening mammogram for malignant neoplasm of breast: Secondary | ICD-10-CM | POA: Diagnosis not present

## 2024-02-08 DIAGNOSIS — M810 Age-related osteoporosis without current pathological fracture: Secondary | ICD-10-CM | POA: Diagnosis not present

## 2024-02-08 NOTE — Progress Notes (Signed)
 Contacted via MyChart  Good afternoon Laura Gibson, your DEXA scan has returned and is showing some progression into osteoporosis, or more fragile bone.  I recommend scheduling a visit with me so we can further discuss and discuss treatment options.  Any questions?

## 2024-02-12 ENCOUNTER — Ambulatory Visit: Payer: Self-pay | Admitting: Nurse Practitioner

## 2024-02-12 NOTE — Progress Notes (Signed)
 Contacted via MyChart   Normal mammogram, may repeat in one year:)

## 2024-02-22 ENCOUNTER — Other Ambulatory Visit: Payer: Self-pay | Admitting: Nurse Practitioner

## 2024-02-22 NOTE — Telephone Encounter (Unsigned)
 Copied from CRM [id number]. Topic: Clinical - Medication Refill >> Feb 22, 2024 10:00 AM Carlatta H wrote: Medication: amLODipine  (NORVASC ) 10 MG tablet [[id number]]  Has the patient contacted their pharmacy? No (Agent: If no, request that the patient contact the pharmacy for the refill. If patient does not wish to contact the pharmacy document the reason why and proceed with request.) (Agent: If yes, when and what did the pharmacy advise?)  This is the patient's preferred pharmacy:   [facility] [address] [place], [place], [place] [address]  Is this the correct pharmacy for this prescription? Yes If no, delete pharmacy and type the correct one.   Has the prescription been filled recently? No  Is the patient out of the medication? Yes  Has the patient been seen for an appointment in the last year OR does the patient have an upcoming appointment? Yes  Can we respond through MyChart? Yes  Agent: Please be advised that Rx refills may take up to 3 business days. We ask that you follow-up with your pharmacy.

## 2024-02-23 MED ORDER — AMLODIPINE BESYLATE 5 MG PO TABS: 5.0000 mg | ORAL_TABLET | Freq: Every day | ORAL | 1 refills | Status: DC

## 2024-02-23 NOTE — Telephone Encounter (Signed)
 Requested Prescriptions  Pending Prescriptions Disp Refills   amLODipine  (NORVASC ) 5 MG tablet 90 tablet 1    Sig: Take 1 tablet (5 mg total) by mouth daily.     Cardiovascular: Calcium Channel Blockers 2 Failed - 02/23/2024  4:42 PM      Failed - Last BP in normal range    BP Readings from Last 1 Encounters:  01/25/24 (!) 148/81         Passed - Last Heart Rate in normal range    Pulse Readings from Last 1 Encounters:  01/25/24 62         Passed - Valid encounter within last 6 months    Recent Outpatient Visits           4 weeks ago Essential hypertension   [place] [facility] [place], Melanie T, NP   6 months ago Essential hypertension   [place] [facility] [place], Melanie T, NP   7 months ago Medicare annual wellness visit, subsequent   [facility] [place], Melanie DASEN, NP

## 2024-02-26 MED ORDER — AMLODIPINE BESYLATE 10 MG PO TABS: 10.0000 mg | ORAL_TABLET | Freq: Every day | ORAL | 3 refills | Status: AC

## 2024-02-26 NOTE — Addendum Note (Signed)
 Addended by: Kaarin Pardy M on: 02/26/2024 11:08 AM   Modules accepted: Orders

## 2024-02-27 ENCOUNTER — Other Ambulatory Visit: Payer: Self-pay | Admitting: Nurse Practitioner

## 2024-02-29 NOTE — Telephone Encounter (Signed)
 Requested Prescriptions  Pending Prescriptions Disp Refills   losartan  (COZAAR ) 100 MG tablet [Pharmacy Med Name: LOSARTAN  100MG  TABLETS] 90 tablet 0    Sig: TAKE 1 TABLET(100 MG) BY MOUTH DAILY     Cardiovascular:  Angiotensin Receptor Blockers Failed - 02/29/2024 11:41 AM      Failed - Cr in normal range and within 180 days    Creatinine, Ser  Date Value Ref Range Status  07/21/2023 0.71 0.57 - 1.00 mg/dL Final         Failed - K in normal range and within 180 days    Potassium  Date Value Ref Range Status  07/21/2023 4.2 3.5 - 5.2 mmol/L Final         Failed - Last BP in normal range    BP Readings from Last 1 Encounters:  01/25/24 (!) 148/81         Passed - Patient is not pregnant      Passed - Valid encounter within last 6 months    Recent Outpatient Visits           1 month ago Essential hypertension   [place] [facility] [place], Cisne T, NP   6 months ago Essential hypertension   [place] [facility] [place], [place] T, NP   7 months ago Medicare annual wellness visit, subsequent   [place] [facility] [place], Melanie DASEN, NP

## 2024-03-25 ENCOUNTER — Encounter: Payer: Medicare Other | Admitting: Dermatology

## 2024-03-27 ENCOUNTER — Ambulatory Visit: Admitting: Dermatology

## 2024-03-27 DIAGNOSIS — L578 Other skin changes due to chronic exposure to nonionizing radiation: Secondary | ICD-10-CM | POA: Diagnosis not present

## 2024-03-27 DIAGNOSIS — D2271 Melanocytic nevi of right lower limb, including hip: Secondary | ICD-10-CM

## 2024-03-27 DIAGNOSIS — D225 Melanocytic nevi of trunk: Secondary | ICD-10-CM

## 2024-03-27 DIAGNOSIS — D1722 Benign lipomatous neoplasm of skin and subcutaneous tissue of left arm: Secondary | ICD-10-CM

## 2024-03-27 DIAGNOSIS — D1801 Hemangioma of skin and subcutaneous tissue: Secondary | ICD-10-CM

## 2024-03-27 DIAGNOSIS — L820 Inflamed seborrheic keratosis: Secondary | ICD-10-CM

## 2024-03-27 DIAGNOSIS — D229 Melanocytic nevi, unspecified: Secondary | ICD-10-CM

## 2024-03-27 DIAGNOSIS — L821 Other seborrheic keratosis: Secondary | ICD-10-CM

## 2024-03-27 DIAGNOSIS — W908XXA Exposure to other nonionizing radiation, initial encounter: Secondary | ICD-10-CM

## 2024-03-27 DIAGNOSIS — Z1283 Encounter for screening for malignant neoplasm of skin: Secondary | ICD-10-CM | POA: Diagnosis not present

## 2024-03-27 DIAGNOSIS — I781 Nevus, non-neoplastic: Secondary | ICD-10-CM

## 2024-03-27 DIAGNOSIS — L814 Other melanin hyperpigmentation: Secondary | ICD-10-CM

## 2024-03-27 NOTE — Patient Instructions (Addendum)
 Cryotherapy Aftercare  Wash gently with soap and water  everyday.   Apply Vaseline and Band-Aid daily until healed.   Seborrheic Keratosis  What causes seborrheic keratoses? Seborrheic keratoses are harmless, common skin growths that first appear during adult life.  As time goes by, more growths appear.  Some people may develop a large number of them.  Seborrheic keratoses appear on both covered and uncovered body parts.  They are not caused by sunlight.  The tendency to develop seborrheic keratoses can be inherited.  They vary in color from skin-colored to gray, brown, or even black.  They can be either smooth or have a rough, warty surface.   Seborrheic keratoses are superficial and look as if they were stuck on the skin.  Under the microscope this type of keratosis looks like layers upon layers of skin.  That is why at times the top layer may seem to fall off, but the rest of the growth remains and re-grows.    Treatment Seborrheic keratoses do not need to be treated, but can easily be removed in the office.  Seborrheic keratoses often cause symptoms when they rub on clothing or jewelry.  Lesions can be in the way of shaving.  If they become inflamed, they can cause itching, soreness, or burning.  Removal of a seborrheic keratosis can be accomplished by freezing, burning, or surgery. If any spot bleeds, scabs, or grows rapidly, please return to have it checked, as these can be an indication of a skin cancer.    Melanoma ABCDEs  Melanoma is the most dangerous type of skin cancer, and is the leading cause of death from skin disease.  You are more likely to develop melanoma if you: Have light-colored skin, light-colored eyes, or red or blond hair Spend a lot of time in the sun Tan regularly, either outdoors or in a tanning bed Have had blistering sunburns, especially during childhood Have a close family member who has had a melanoma Have atypical moles or large birthmarks  Early detection  of melanoma is key since treatment is typically straightforward and cure rates are extremely high if we catch it early.   The first sign of melanoma is often a change in a mole or a new dark spot.  The ABCDE system is a way of remembering the signs of melanoma.  A for asymmetry:  The two halves do not match. B for border:  The edges of the growth are irregular. C for color:  A mixture of colors are present instead of an even brown color. D for diameter:  Melanomas are usually (but not always) greater than 6mm - the size of a pencil eraser. E for evolution:  The spot keeps changing in size, shape, and color.  Please check your skin once per month between visits. You can use a small mirror in front and a large mirror behind you to keep an eye on the back side or your body.   If you see any new or changing lesions before your next follow-up, please call to schedule a visit.  Please continue daily skin protection including broad spectrum sunscreen SPF 30+ to sun-exposed areas, reapplying every 2 hours as needed when you're outdoors.   Staying in the shade or wearing long sleeves, sun glasses (UVA+UVB protection) and wide brim hats (4-inch brim around the entire circumference of the hat) are also recommended for sun protection.    Due to recent changes in healthcare laws, you may see results of your pathology and/or  laboratory studies on MyChart before the doctors have had a chance to review them. We understand that in some cases there may be results that are confusing or concerning to you. Please understand that not all results are received at the same time and often the doctors may need to interpret multiple results in order to provide you with the best plan of care or course of treatment. Therefore, we ask that you please give us  2 business days to thoroughly review all your results before contacting the office for clarification. Should we see a critical lab result, you will be contacted  sooner.   If You Need Anything After Your Visit  If you have any questions or concerns for your doctor, please call our main line at [phone] and press option 4 to reach your doctor's medical assistant. If no one answers, please leave a voicemail as directed and we will return your call as soon as possible. Messages left after 4 pm will be answered the following business day.   You may also send us  a message via MyChart. We typically respond to MyChart messages within 1-2 business days.  For prescription refills, please ask your pharmacy to contact our office. Our fax number is [phone].  If you have an urgent issue when the clinic is closed that cannot wait until the next business day, you can page your doctor at the number below.    Please note that while we do our best to be available for urgent issues outside of office hours, we are not available 24/7.   If you have an urgent issue and are unable to reach us , you may choose to seek medical care at your doctor's office, retail clinic, urgent care center, or emergency room.  If you have a medical emergency, please immediately call 911 or go to the emergency department.  Pager Numbers  - Dr. Hester: [phone]  - Dr. Jackquline: [phone]  - Dr. Claudene: [phone]   - Dr. Raymund: [phone]  In the event of inclement weather, please call our main line at [phone] for an update on the status of any delays or closures.  Dermatology Medication Tips: Please keep the boxes that topical medications come in in order to help keep track of the instructions about where and how to use these. Pharmacies typically print the medication instructions only on the boxes and not directly on the medication tubes.   If your medication is too expensive, please contact our office at [phone] option 4 or send us  a message through MyChart.   We are unable to tell what your co-pay for medications will be in advance as this is  different depending on your insurance coverage. However, we may be able to find a substitute medication at lower cost or fill out paperwork to get insurance to cover a needed medication.   If a prior authorization is required to get your medication covered by your insurance company, please allow us  1-2 business days to complete this process.  Drug prices often vary depending on where the prescription is filled and some pharmacies may offer cheaper prices.  The website www.goodrx.com contains coupons for medications through different pharmacies. The prices here do not account for what the cost may be with help from insurance (it may be cheaper with your insurance), but the website can give you the price if you did not use any insurance.  - You can print the associated coupon and take it with your prescription to the pharmacy.  - You  may also stop by our office during regular business hours and pick up a GoodRx coupon card.  - If you need your prescription sent electronically to a different pharmacy, notify our office through [facility] or by phone at [phone] option 4.     Si Usted Necesita Algo Despus de Su Visita  Tambin puede enviarnos un mensaje a travs de Clinical cytogeneticist. Por lo general respondemos a los mensajes de MyChart en el transcurso de 1 a 2 das hbiles.  Para renovar recetas, por favor pida a su farmacia que se ponga en contacto con nuestra oficina. Randi lakes de fax es [employer] [phone].  Si tiene un asunto urgente cuando la clnica est cerrada y que no puede esperar hasta el siguiente da hbil, puede llamar/localizar a su doctor(a) al nmero que aparece a continuacin.   Por favor, tenga en cuenta que aunque hacemos todo lo posible para estar disponibles para asuntos urgentes fuera del horario de [place], no estamos disponibles las 24 horas del da, los 7 [address]  [address] de la [place].   Si tiene un problema urgente y no puede comunicarse con nosotros, puede optar por buscar  atencin mdica  en el consultorio de su doctor(a), en una clnica privada, en un centro de atencin urgente o en una sala de emergencias.  Si tiene Engineer, drilling, por favor llame inmediatamente al 911 o vaya a la sala de emergencias.  Nmeros de bper  - Dr. Hester: [phone]  - Dra. Jackquline: [phone]  - Dr. Claudene: [phone]  - Dra. Kitts: [phone]  En caso de inclemencias del [place], por favor llame a nuestra lnea principal al [phone] para una actualizacin sobre el estado de cualquier retraso o cierre.  Consejos para la medicacin en dermatologa: Por favor, guarde las cajas en las que vienen los medicamentos de uso tpico para ayudarle a seguir las instrucciones sobre dnde y cmo usarlos. Las farmacias generalmente imprimen las instrucciones del medicamento slo en las cajas y no directamente en los tubos del [place].   Si su medicamento es muy caro, por favor, pngase en contacto con landry rieger llamando al [phone] y presione la opcin 4 o envenos un mensaje a travs de Clinical cytogeneticist.   No podemos decirle cul ser su copago por los medicamentos por adelantado ya que esto es diferente dependiendo de la cobertura de su seguro. Sin embargo, es posible que podamos encontrar un medicamento sustituto a Audiological scientist un formulario para que el seguro cubra el medicamento que se considera necesario.   Si se requiere una autorizacin previa para que su compaa de seguros malta su medicamento, por favor permtanos de 1 a 2 das hbiles para completar este proceso.  Los precios de los medicamentos varan con frecuencia dependiendo del Environmental consultant de dnde se surte la receta y alguna farmacias pueden ofrecer precios ms baratos.  El sitio web www.goodrx.com tiene cupones para medicamentos de Health and safety inspector. Los precios aqu no tienen en cuenta lo que podra costar con la ayuda del seguro (puede ser ms barato con su seguro), pero el sitio web puede  darle el precio si no utiliz Tourist information centre manager.  - Puede imprimir el cupn correspondiente y llevarlo con su receta a la farmacia.  - Tambin puede pasar por nuestra oficina durante el horario de atencin regular y Education officer, museum una tarjeta de cupones de GoodRx.  - Si necesita que su receta se enve electrnicamente a Psychiatrist, informe a nuestra oficina a travs de MyChart de [employer] o por telfono  llamando al [phone] y presione la opcin 4.

## 2024-03-27 NOTE — Progress Notes (Signed)
 Follow-Up Visit   Subjective  Laura Gibson is a 73 y.o. female who presents for the following: Skin Cancer Screening and Full Body Skin Exam  The patient presents for Total-Body Skin Exam (TBSE) for skin cancer screening and mole check. The patient has spots, moles and lesions to be evaluated, some may be new or changing, including left shoulder new since last visit, rubbed by bra; similar on the chest. Also an itchy spot on the right lower neck. She also has a spot on the left cheek that has been treated with cryotherapy twice; went away and came back. Also some other new changing spots with are itchy on face, leg, chest. Hx of AKs.   The following portions of the chart were reviewed this encounter and updated as appropriate: medications, allergies, medical history  Review of Systems:  No other skin or systemic complaints except as noted in HPI or Assessment and Plan.  Objective  Well appearing patient in no apparent distress; mood and affect are within normal limits.  A full examination was performed including scalp, head, eyes, ears, nose, lips, neck, chest, axillae, abdomen, back, buttocks, bilateral upper extremities, bilateral lower extremities, hands, feet, fingers, toes, fingernails, and toenails. All findings within normal limits unless otherwise noted below.   Relevant physical exam findings are noted in the Assessment and Plan.  L post shoulder x 1, R lower neck x 1, L lat cheek x 1 (recurrent), R lower medial pretibia x 2, L upper forehead x 1, lower sternum x 1 (7) Erythematous stuck-on, waxy papule or plaque  Telangiectasia - left malar cheek  Assessment & Plan   SKIN CANCER SCREENING PERFORMED TODAY.  ACTINIC DAMAGE - Chronic condition, secondary to cumulative UV/sun exposure - diffuse scaly erythematous macules with underlying dyspigmentation - Recommend daily broad spectrum sunscreen SPF 30+ to sun-exposed areas, reapply every 2 hours as needed.  - Staying in  the shade or wearing long sleeves, sun glasses (UVA+UVB protection) and wide brim hats (4-inch brim around the entire circumference of the hat) are also recommended for sun protection.  - Call for new or changing lesions.  LENTIGINES, SEBORRHEIC KERATOSES, HEMANGIOMAS - Benign normal skin lesions - Benign-appearing - Call for any changes  MELANOCYTIC NEVI - Tan-brown and/or pink-flesh-colored symmetric macules and papules - L mid abdomen 0.6 x 0.4 cm med brown thin papule with lighter center. - R post thigh 0.3 cm speckled brown macule.  - L gluteal cleft 0.4 cm speckled brown macule - Benign appearing on exam today - Observation - Call clinic for new or changing moles - Recommend daily use of broad spectrum spf 30+ sunscreen to sun-exposed areas.   TELANGIECTASIA Exam: L malar cheek 0.7 x 0.5 cm blanching, pink/red macule. Benign features under dermoscopy. Photo taken today.   Treatment Plan: Benign appearing on exam, stable. Call for changes  Lipoma  Exam: Subcutaneous rubbery nodule(s) Location: left forearm  Benign-appearing. Exam most consistent with an Lipoma. Discussed that a Lipoma is a benign fatty growth that can grow over time and sometimes become painful or otherwise symptomatic. Some patients may have one or several lipomas.. Benign Hereditary Lipomatosis is a hereditary familial condition where family members tend to grow multiple lipomas.  Recommend observation if it is not changing, growing or symptomatic. Recommend surgical excision to remove it if it is painful, growing, symptomatic, or other changes noted. Please contact our office for new or changing lesions so they can be evaluated.  INFLAMED SEBORRHEIC KERATOSIS (7) L post  shoulder x 1, R lower neck x 1, L lat cheek x 1 (recurrent), R lower medial pretibia x 2, L upper forehead x 1, lower sternum x 1 (7) Symptomatic, irritating, patient would like treated. Destruction of lesion - L post shoulder x 1, R lower  neck x 1, L lat cheek x 1 (recurrent), R lower medial pretibia x 2, L upper forehead x 1, lower sternum x 1 (7)  Destruction method: cryotherapy   Informed consent: discussed and consent obtained   Lesion destroyed using liquid nitrogen: Yes   Region frozen until ice ball extended beyond lesion: Yes   Outcome: patient tolerated procedure well with no complications   Post-procedure details: wound care instructions given   Additional details:  Prior to procedure, discussed risks of blister formation, small wound, skin dyspigmentation, or rare scar following cryotherapy. Recommend Vaseline ointment to treated areas while healing.   Return in about 1 year (around 03/27/2025) for TBSE, Hx AKs.  IAndrea Kerns, CMA, am acting as scribe for Rexene Rattler, MD .   Documentation: I have reviewed the above documentation for accuracy and completeness, and I agree with the above.  Rexene Rattler, MD

## 2024-04-04 ENCOUNTER — Other Ambulatory Visit: Payer: Self-pay | Admitting: Nurse Practitioner

## 2024-04-05 NOTE — Telephone Encounter (Signed)
 Discontinued 02/23/24.  Requested Prescriptions  Pending Prescriptions Disp Refills   amLODipine  (NORVASC ) 5 MG tablet [Pharmacy Med Name: AMLODIPINE  BESYLATE 5MG  TABLETS] 90 tablet 1    Sig: TAKE 1 TABLET(5 MG) BY MOUTH DAILY     Cardiovascular: Calcium Channel Blockers 2 Failed - 04/05/2024  4:41 PM      Failed - Last BP in normal range    BP Readings from Last 1 Encounters:  01/25/24 (!) 148/81         Passed - Last Heart Rate in normal range    Pulse Readings from Last 1 Encounters:  01/25/24 62         Passed - Valid encounter within last 6 months    Recent Outpatient Visits           2 months ago Essential hypertension   [place] [facility] [address] [place], [place] T, NP   7 months ago Essential hypertension   [place] [facility] [place], Melanie T, NP   8 months ago Medicare annual wellness visit, subsequent   [place] [facility] [place], Melanie DASEN, NP

## 2024-05-31 ENCOUNTER — Encounter: Payer: Self-pay | Admitting: Nurse Practitioner

## 2024-07-25 ENCOUNTER — Encounter: Admitting: Nurse Practitioner

## 2024-07-30 ENCOUNTER — Encounter: Admitting: Nurse Practitioner

## 2025-03-31 ENCOUNTER — Encounter: Admitting: Dermatology
